# Patient Record
Sex: Female | Born: 1970 | Race: White | Hispanic: Yes | Marital: Married | Smoking: Current every day smoker
Health system: Southern US, Community
[De-identification: ages and names within clinical notes are randomized; demographics above are authoritative.]

## PROBLEM LIST (undated history)

## (undated) DIAGNOSIS — J45909 Unspecified asthma, uncomplicated: Secondary | ICD-10-CM

## (undated) DIAGNOSIS — E079 Disorder of thyroid, unspecified: Secondary | ICD-10-CM

## (undated) DIAGNOSIS — Z720 Tobacco use: Secondary | ICD-10-CM

## (undated) HISTORY — DX: Tobacco use: Z72.0

## (undated) HISTORY — DX: Unspecified asthma, uncomplicated: J45.909

## (undated) HISTORY — DX: Disorder of thyroid, unspecified: E07.9

---

## 2015-03-10 ENCOUNTER — Telehealth: Payer: Self-pay | Admitting: Behavioral Health

## 2015-03-10 NOTE — Telephone Encounter (Signed)
Unable to reach patient at time of Pre-Visit Call.  Left message for patient to return call when available.    

## 2015-03-13 ENCOUNTER — Encounter: Payer: Self-pay | Admitting: Family

## 2015-03-13 ENCOUNTER — Ambulatory Visit (INDEPENDENT_AMBULATORY_CARE_PROVIDER_SITE_OTHER): Payer: BLUE CROSS/BLUE SHIELD | Admitting: Family

## 2015-03-13 VITALS — BP 108/70 | HR 64 | Temp 98.0°F | Resp 16 | Ht 60.25 in | Wt 162.4 lb

## 2015-03-13 DIAGNOSIS — J4520 Mild intermittent asthma, uncomplicated: Secondary | ICD-10-CM

## 2015-03-13 DIAGNOSIS — R87619 Unspecified abnormal cytological findings in specimens from cervix uteri: Secondary | ICD-10-CM

## 2015-03-13 DIAGNOSIS — C539 Malignant neoplasm of cervix uteri, unspecified: Secondary | ICD-10-CM

## 2015-03-13 DIAGNOSIS — E0590 Thyrotoxicosis, unspecified without thyrotoxic crisis or storm: Secondary | ICD-10-CM

## 2015-03-13 DIAGNOSIS — Z8639 Personal history of other endocrine, nutritional and metabolic disease: Secondary | ICD-10-CM

## 2015-03-13 DIAGNOSIS — Z720 Tobacco use: Secondary | ICD-10-CM

## 2015-03-13 MED ORDER — PROAIR HFA 108 (90 BASE) MCG/ACT IN AERS: INHALATION_SPRAY | RESPIRATORY_TRACT | Status: DC

## 2015-03-13 MED ORDER — VARENICLINE TARTRATE 0.5 MG X 11 & 1 MG X 42 PO MISC: ORAL | Status: DC

## 2015-03-13 MED ORDER — FLUTICASONE-SALMETEROL 250-50 MCG/DOSE IN AEPB: 1.0000 | INHALATION_SPRAY | Freq: Two times a day (BID) | RESPIRATORY_TRACT | Status: DC

## 2015-03-13 MED ORDER — ALBUTEROL SULFATE (2.5 MG/3ML) 0.083% IN NEBU: 2.5000 mg | INHALATION_SOLUTION | Freq: Four times a day (QID) | RESPIRATORY_TRACT | Status: DC | PRN

## 2015-03-13 NOTE — Progress Notes (Signed)
Pre visit review using our clinic review tool, if applicable. No additional management support is needed unless otherwise documented below in the visit note. 

## 2015-03-13 NOTE — Progress Notes (Signed)
Subjective:    Patient ID: Makayla Hall, female    DOB: 17-Jun-1971, 44 y.o.   MRN: [id number]  HPI   [name] is a 44 yr old female who presents today to establish care.   Hx of abnormal pap- reports that she has hx of ? cervical "carcinoma" remotely. She does have hx of fibroids. She has not found local GYN.    Asthma- Reports that she is wheezing and needing to use her inhaler multiple times a day.  Did recently have a pred treatment. Symptoms are worse at night. Uses nebulizer prn.  She is a smoker.  Wants to quit.    History of hyperthyroid- 2008 diagnosed. She was treated with medication but gained 40 pounds so stopped meds. Denies heat intolerance. Sister had thyroid cancer.  Denies diarrhea or weight loss.      Review of Systems  Constitutional:       Has lost 20 pounds in last 1.5 months  HENT: Negative for rhinorrhea.   Eyes: Negative for visual disturbance.  Respiratory: Negative for cough.   Cardiovascular: Negative for chest pain.  Gastrointestinal: Negative for diarrhea and constipation.  Genitourinary: Negative for menstrual problem.  Musculoskeletal: Negative for myalgias and arthralgias.  Skin: Negative for rash.  Neurological: Negative for headaches.  Hematological: Negative for adenopathy.  Psychiatric/Behavioral:       Denies depression/anxiety   See HPI  Past Medical History  Diagnosis Date  . Asthma   . Thyroid disease     hyperthyroidism    History   Social History  . Marital Status: Married    Spouse Name: N/A  . Number of Children: N/A  . Years of Education: N/A   Occupational History  . Not on file.   Social History Main Topics  . Smoking status: Current Every Day Smoker -- 25 years  . Smokeless tobacco: Never Used     Comment: 5-8 cigarettes daily  . Alcohol Use: 0.0 oz/week    0 Standard drinks or equivalent per week     Comment: occasional  . Drug Use: No  . Sexual Activity: Not on file   Other Topics Concern  . Not on  file   Social History Narrative  . No narrative on file    History reviewed. No pertinent past surgical history.  Family History  Problem Relation Age of Onset  . Arthritis Mother   . Fibromyalgia Mother   . Cancer Maternal Aunt 68    lung    Allergies not on file  No current outpatient prescriptions on file prior to visit.   No current facility-administered medications on file prior to visit.    BP 108/70 mmHg  Pulse 64  Temp(Src) 98 F (36.7 C) (Oral)  Resp 16  Ht 5' 0.25" (1.53 m)  Wt 162 lb 6.4 oz (73.664 kg)  BMI 31.47 kg/m2  SpO2 99%  LMP 03/06/2015       Objective:   Physical Exam  Constitutional: She is oriented to person, place, and time. She appears well-developed and well-nourished.  HENT:  Head: Normocephalic and atraumatic.  Right Ear: Tympanic membrane and ear canal normal.  Left Ear: Tympanic membrane and ear canal normal.  Mouth/Throat: No oropharyngeal exudate, posterior oropharyngeal edema or posterior oropharyngeal erythema.  Eyes: No scleral icterus.  Neck: No thyromegaly present.  Cardiovascular: Normal rate, regular rhythm and normal heart sounds.   No murmur heard. Pulmonary/Chest: Effort normal and breath sounds normal. No respiratory distress. She has no wheezes.  Musculoskeletal: She exhibits no edema.  Lymphadenopathy:    She has no cervical adenopathy.  Neurological: She is alert and oriented to person, place, and time.  Skin: Skin is warm and dry. No rash noted.  Psychiatric: She has a normal mood and affect. Her behavior is normal. Judgment and thought content normal.          Assessment & Plan:

## 2015-03-13 NOTE — Patient Instructions (Addendum)
Schedule lab visit at the front desk. You will be contacted about the thyroid ultrasound. Start advair, continue albuterol as needed.   Start chantix. Follow up in 1 month.

## 2015-03-14 ENCOUNTER — Encounter: Payer: Self-pay | Admitting: Family

## 2015-03-15 ENCOUNTER — Encounter: Payer: Self-pay | Admitting: Family

## 2015-03-15 DIAGNOSIS — Z720 Tobacco use: Secondary | ICD-10-CM | POA: Insufficient documentation

## 2015-03-15 DIAGNOSIS — C539 Malignant neoplasm of cervix uteri, unspecified: Secondary | ICD-10-CM | POA: Insufficient documentation

## 2015-03-15 DIAGNOSIS — J45901 Unspecified asthma with (acute) exacerbation: Secondary | ICD-10-CM | POA: Insufficient documentation

## 2015-03-15 DIAGNOSIS — Z8639 Personal history of other endocrine, nutritional and metabolic disease: Secondary | ICD-10-CM | POA: Insufficient documentation

## 2015-03-15 NOTE — Assessment & Plan Note (Signed)
Will refer to GYN for surveillance/follow up.

## 2015-03-15 NOTE — Assessment & Plan Note (Signed)
Obtain follow up TFT's.

## 2015-03-15 NOTE — Assessment & Plan Note (Signed)
We discussed importance of smoking cessation.  Asthma is uncontrolled and likely exacerbated by ongoing smoking.  Add advair.

## 2015-03-15 NOTE — Assessment & Plan Note (Signed)
Trial of chantix. Common side effects including rare risk of suicide ideation was discussed with the patient today.  Patient is instructed to go directly to the ED if this occurs.  We discussed that patient can continue to smoke for 1 week after starting chantix, but then must discontinue cigarettes.  He is also instructed to contact us prior to completion of the starter month pack for an rx for the continuation month pack.  5 minutes spent with patient today on tobacco cessation counseling.   

## 2015-03-19 ENCOUNTER — Telehealth: Payer: Self-pay | Admitting: Family

## 2015-03-19 NOTE — Telephone Encounter (Signed)
Please remind pt to complete lab work (TFT's) ordered at visit.

## 2015-03-20 NOTE — Telephone Encounter (Signed)
LM for return call

## 2015-03-26 ENCOUNTER — Encounter: Payer: Self-pay | Admitting: Family

## 2015-03-30 NOTE — Telephone Encounter (Signed)
Attempted to reach pt and received voicemail.  Mailed letter / reminder to pt.

## 2015-04-17 ENCOUNTER — Ambulatory Visit: Payer: BLUE CROSS/BLUE SHIELD | Admitting: Family

## 2015-04-17 ENCOUNTER — Telehealth: Payer: Self-pay | Admitting: Family

## 2015-04-21 NOTE — Telephone Encounter (Signed)
Yes please

## 2015-04-21 NOTE — Telephone Encounter (Signed)
Pt was no show 04/17/15 5:00pm, 1 month f/u appt, left msg for pt to call and reschedule, charge for no show?

## 2015-05-02 ENCOUNTER — Other Ambulatory Visit: Payer: BLUE CROSS/BLUE SHIELD

## 2015-05-03 ENCOUNTER — Encounter: Payer: Self-pay | Admitting: Family

## 2015-05-03 LAB — T4, FREE: Free T4: 0.8 ng/dL (ref 0.60–1.60)

## 2015-05-03 LAB — T3, FREE: T3 FREE: 3.1 pg/mL (ref 2.3–4.2)

## 2015-05-03 LAB — TSH: TSH: 0.95 u[IU]/mL (ref 0.35–4.50)

## 2015-06-06 ENCOUNTER — Telehealth: Payer: Self-pay | Admitting: Family

## 2015-06-06 MED ORDER — VARENICLINE TARTRATE 1 MG PO TABS: 1.0000 mg | ORAL_TABLET | Freq: Two times a day (BID) | ORAL | Status: DC

## 2015-06-06 NOTE — Telephone Encounter (Signed)
Relation to TI:WPYK Call back [place]: [id number] - [place], [place] - [address] AT [place] [phone] (Phone) [phone] (Fax)         Reason for call:  Patient requesting a refill varenicline (CHANTIX STARTING MONTH PAK) 0.5 MG X 11 & 1 MG X 42 tablet

## 2015-06-14 ENCOUNTER — Encounter: Payer: Self-pay | Admitting: Family

## 2015-06-14 ENCOUNTER — Ambulatory Visit (INDEPENDENT_AMBULATORY_CARE_PROVIDER_SITE_OTHER): Payer: BLUE CROSS/BLUE SHIELD | Admitting: Family

## 2015-06-14 ENCOUNTER — Ambulatory Visit: Payer: BLUE CROSS/BLUE SHIELD | Admitting: Family

## 2015-06-14 VITALS — BP 102/66 | HR 86 | Temp 98.0°F | Resp 16 | Ht 60.25 in

## 2015-06-14 DIAGNOSIS — J45909 Unspecified asthma, uncomplicated: Secondary | ICD-10-CM

## 2015-06-14 DIAGNOSIS — J209 Acute bronchitis, unspecified: Secondary | ICD-10-CM | POA: Diagnosis not present

## 2015-06-14 MED ORDER — AZITHROMYCIN 250 MG PO TABS: ORAL_TABLET | ORAL | Status: DC

## 2015-06-14 MED ORDER — PREDNISONE 10 MG PO TABS
ORAL_TABLET | ORAL | Status: DC
Start: 2015-06-14 — End: 2015-08-02

## 2015-06-14 MED ORDER — GUAIFENESIN-CODEINE 100-10 MG/5ML PO SYRP: 5.0000 mL | ORAL_SOLUTION | Freq: Three times a day (TID) | ORAL | Status: DC | PRN

## 2015-06-14 NOTE — Progress Notes (Signed)
Subjective:    Patient ID: Makayla Hall, female    DOB: 03/13/1971, 44 y.o.   MRN: [id number]  HPI   Ms. Coles is a 44 yr old female who presents today with chief complaint of cough. Cough has been present x 3 weeks. Reports difficulty sleeping due to cough.  Tried advair without improvement (and developed rash).  Cough so hard that she has had some urinary leakage.  Took 2 20mg  prednisone which she feels helped.  Denies fever.  + sweating.  Cough is dry mostly.   Tobacco abuse-  Down to 2 cigs a day.  Tolerating chantix without side effects.    Review of Systems    see HPI  Past Medical History  Diagnosis Date  . Asthma   . Thyroid disease     hyperthyroidism  . Tobacco abuse     Social History   Social History  . Marital Status: Married    Spouse Name: N/A  . Number of Children: N/A  . Years of Education: N/A   Occupational History  . Not on file.   Social History Main Topics  . Smoking status: Current Every Day Smoker -- 25 years  . Smokeless tobacco: Never Used     Comment: 2 cigarettes daily  . Alcohol Use: 0.0 oz/week    0 Standard drinks or equivalent per week     Comment: occasional  . Drug Use: No  . Sexual Activity: Not on file   Other Topics Concern  . 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  Gardening       No past surgical history on file.  Family History  Problem Relation Age of Onset  . Arthritis Mother   . Fibromyalgia Mother     severe  . Cancer Maternal Aunt 68    lung  . Hypertension Father   . Obesity Sister   . Diabetes Sister   . Thyroid cancer Sister   . Diabetes Sister     Allergies  Allergen Reactions  . Advair Diskus [Fluticasone-Salmeterol] Rash    Current Outpatient Prescriptions on File  Prior to Visit  Medication Sig Dispense Refill  . albuterol (PROVENTIL) (2.5 MG/3ML) 0.083% nebulizer solution Take 3 mLs (2.5 mg total) by nebulization every 6 (six) hours as needed for wheezing or shortness of breath. 150 mL 1  . PROAIR HFA 108 (90 BASE) MCG/ACT inhaler Inhale 1 to 2 puffs as needed for asthma flare ups. 18 g 3  . varenicline (CHANTIX CONTINUING MONTH PAK) 1 MG tablet Take 1 tablet (1 mg total) by mouth 2 (two) times daily. 60 tablet 1   No current facility-administered medications on file prior to visit.    BP 102/66 mmHg  Pulse 86  Temp(Src) 98 F (36.7 C) (Oral)  Resp 16  Ht 5' 0.25" (1.53 m)  Wt   SpO2 98%  LMP 06/02/2015    Objective:   Physical Exam  Constitutional: She is oriented to person, place, and time. She appears well-developed and well-nourished.  HENT:  Head: Normocephalic.  Right Ear: Tympanic membrane and ear canal normal.  Left Ear: Tympanic membrane and ear canal normal.  Mouth/Throat: No oropharyngeal exudate, posterior oropharyngeal edema or posterior oropharyngeal erythema.  Cardiovascular: Normal rate, regular rhythm and normal heart sounds.   No murmur heard. Pulmonary/Chest: Effort normal. No respiratory distress. She has wheezes.  Musculoskeletal: She exhibits no edema.  Neurological: She is alert and oriented to person, place, and time.  Psychiatric: She has a normal mood and affect. Her behavior is normal. Judgment and thought content normal.          Assessment & Plan:  Bronchitis with acute asthma exacerbation- will rx with zpak, prednisone, prn cheratussin- pt advised this may cause drowsiness. Albuterol PRN. Urged complete smoking cessation.

## 2015-06-14 NOTE — Patient Instructions (Signed)
Start prednisone taper and zpak.  You may use cheratussin up to 3 x day, but this may cause drowsiness so use caution.  Continue albuterol 2 puffs every 6 hours. Call if symptoms worsen or if symptoms are not improved in 1 week.  Good luck quitting smoking.

## 2015-08-01 ENCOUNTER — Telehealth: Payer: Self-pay | Admitting: Family

## 2015-08-01 NOTE — Telephone Encounter (Signed)
Relation to VO:HCSP Call back [place]: [facility] - [place], [place] - [address] AT [place] [phone] (Phone) [phone] (Fax)         Reason for call:  Patient requesting the following the medication predniSONE (DELTASONE) 10 MG tablet, albuterol (PROVENTIL) (2.5 MG/3ML) 0.083% nebulizer solution, azithromycin (ZITHROMAX) 250 MG tablet, PROAIR HFA 108 (90 BASE) MCG/ACT inhaler.

## 2015-08-02 ENCOUNTER — Ambulatory Visit (INDEPENDENT_AMBULATORY_CARE_PROVIDER_SITE_OTHER): Payer: BLUE CROSS/BLUE SHIELD | Admitting: Family

## 2015-08-02 ENCOUNTER — Encounter: Payer: Self-pay | Admitting: Family

## 2015-08-02 VITALS — BP 80/60 | HR 83 | Temp 98.1°F | Resp 16 | Ht 60.25 in

## 2015-08-02 DIAGNOSIS — J4521 Mild intermittent asthma with (acute) exacerbation: Secondary | ICD-10-CM | POA: Diagnosis not present

## 2015-08-02 MED ORDER — BUDESONIDE-FORMOTEROL FUMARATE 80-4.5 MCG/ACT IN AERO: 2.0000 | INHALATION_SPRAY | Freq: Two times a day (BID) | RESPIRATORY_TRACT | Status: DC

## 2015-08-02 MED ORDER — AZITHROMYCIN 250 MG PO TABS: ORAL_TABLET | ORAL | Status: DC

## 2015-08-02 MED ORDER — PROAIR HFA 108 (90 BASE) MCG/ACT IN AERS: INHALATION_SPRAY | RESPIRATORY_TRACT | Status: DC

## 2015-08-02 MED ORDER — PREDNISONE 10 MG PO TABS: ORAL_TABLET | ORAL | Status: DC

## 2015-08-02 NOTE — Telephone Encounter (Signed)
Patient needs to be seen in office due to worsening of her asthma symptoms.

## 2015-08-02 NOTE — Telephone Encounter (Signed)
Notified pt and scheduled her for today at 11:30am with [facility].

## 2015-08-02 NOTE — Telephone Encounter (Signed)
Spoke with pt. She states she is having similar symptoms as when she was seen in September.  Has had increased cough that keeps her up during the night since last Thursday.  She has started taking mucines. Has had to use her inhaler 15-20 times a day and using nebulizer 1-2 times at night. States cough is "ok during the day unless her chest gets cold, if she walks and gets hot then cools down, etc. . . " Denies any fevers. Pt is requesting zpack, prednisone, albuterol inhaler / nebulizer refills. Advised pt she may need office visit for antibiotic and she asks if she can just get the rest of her medications refilled. Thinks her asthma is just flared up.  Please advise?

## 2015-08-02 NOTE — Telephone Encounter (Signed)
Pt calling again about below. Pt has asthma and said that she wanted a call yesterday and is surprised that she didn't hear from anyone. Please let her know if she can get refills on meds below or if she has to come in. She's been having sx since last Thursday. Best ph # [phone] work       OR cell [phone]

## 2015-08-02 NOTE — Patient Instructions (Signed)
Start prednisone taper, zpak for possible bronchitis, add symbicort 2 x daily.   Continue albuterol every 6 hours until symptoms are improved. Continue to work on quitting smoking completely.

## 2015-08-02 NOTE — Assessment & Plan Note (Addendum)
Deteriorated. Add symbicort, pred taper, albuterol Q6, empiric rx with zpak.    We discussed the mild hypopigmentation on her neck/face- offered referral to derm but she declines.  Advised pt to let me know if this worsen. Advised pt that I don't think that this was caused by advair.

## 2015-08-02 NOTE — Progress Notes (Signed)
Subjective:    Patient ID: Makayla Hall, female    DOB: November 06, 1970, 44 y.o.   MRN: [id number]  HPI  Makayla Hall is a 44 yr old female with history of Asthma and tobacco abuse who presents today with chief complaint of cough.  Worse at night. Productive of white thick mucous since last Thursday. Denies fever or nasal congestion.  Tried mucinex, humidifier last night with some   Tobacco abuse- has cut back.  Doesn't smoke on weekends.  3 cig/day during week. Completed chantix.   She does report some "skin rash" on her neck.     Review of Systems See HPI  Past Medical History  Diagnosis Date  . Asthma   . Thyroid disease     hyperthyroidism  . Tobacco abuse     Social History   Social History  . Marital Status: Married    Spouse Name: N/A  . Number of Children: N/A  . Years of Education: N/A   Occupational History  . Not on file.   Social History Main Topics  . Smoking status: Current Every Day Smoker -- 25 years  . Smokeless tobacco: Never Used     Comment: 2 cigarettes on weekend  . Alcohol Use: 0.0 oz/week    0 Standard drinks or equivalent per week     Comment: occasional  . Drug Use: No  . Sexual Activity: Not on file   Other Topics Concern  . 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  Gardening       No past surgical history on file.  Family History  Problem Relation Age of Onset  . Arthritis Mother   . Fibromyalgia Mother     severe  . Cancer Maternal Aunt 68    lung  . Hypertension Father   . Obesity Sister   . Diabetes Sister   . Thyroid cancer Sister   . Diabetes Sister     Allergies  Allergen Reactions  . Advair Diskus [Fluticasone-Salmeterol] Rash    Current Outpatient Prescriptions on File Prior to Visit    Medication Sig Dispense Refill  . albuterol (PROVENTIL) (2.5 MG/3ML) 0.083% nebulizer solution Take 3 mLs (2.5 mg total) by nebulization every 6 (six) hours as needed for wheezing or shortness of breath. 150 mL 1   No current facility-administered medications on file prior to visit.    BP 80/60 mmHg  Pulse 83  Temp(Src) 98.1 F (36.7 C) (Oral)  Resp 16  Ht 5' 0.25" (1.53 m)  Wt   SpO2 98%  LMP 07/19/2015        Objective:   Physical Exam  Constitutional: She is oriented to person, place, and time. She appears well-developed and well-nourished.  HENT:  Right Ear: Tympanic membrane and ear canal normal.  Left Ear: Tympanic membrane and ear canal normal.  Mouth/Throat: Posterior oropharyngeal erythema present. No oropharyngeal exudate or posterior oropharyngeal edema.  Cardiovascular: Normal rate, regular rhythm and normal heart sounds.   No murmur heard. Pulmonary/Chest: Effort normal. No respiratory distress. She has wheezes in the right lower field. She has rhonchi.  Lymphadenopathy:    She has no cervical adenopathy.  Neurological: She is alert and oriented to person, place, and time.  Skin:  Mild hypopigmentation noted on neck/chest   Psychiatric: She has a normal mood and affect. Her behavior is normal. Judgment and thought content normal.          Assessment & Plan:

## 2015-09-01 ENCOUNTER — Ambulatory Visit (INDEPENDENT_AMBULATORY_CARE_PROVIDER_SITE_OTHER): Payer: BLUE CROSS/BLUE SHIELD | Admitting: Behavioral Health

## 2015-09-01 DIAGNOSIS — Z23 Encounter for immunization: Secondary | ICD-10-CM

## 2015-09-01 NOTE — Progress Notes (Signed)
Pre visit review using our clinic review tool, if applicable. No additional management support is needed unless otherwise documented below in the visit note. 

## 2015-12-18 ENCOUNTER — Other Ambulatory Visit: Payer: Self-pay | Admitting: Family

## 2016-01-01 ENCOUNTER — Ambulatory Visit (HOSPITAL_BASED_OUTPATIENT_CLINIC_OR_DEPARTMENT_OTHER)
Admission: RE | Admit: 2016-01-01 | Discharge: 2016-01-01 | Disposition: A | Discharge status: Home / Self Care | Payer: BLUE CROSS/BLUE SHIELD | Source: Ambulatory Visit | Attending: Family | Admitting: Family

## 2016-01-01 ENCOUNTER — Ambulatory Visit (HOSPITAL_BASED_OUTPATIENT_CLINIC_OR_DEPARTMENT_OTHER)
Admission: RE | Admit: 2016-01-01 | Discharge: 2016-01-01 | Disposition: A | Discharge status: Home / Self Care | Payer: BLUE CROSS/BLUE SHIELD | Source: Ambulatory Visit | Attending: Family Medicine | Admitting: Family Medicine

## 2016-01-01 ENCOUNTER — Ambulatory Visit (INDEPENDENT_AMBULATORY_CARE_PROVIDER_SITE_OTHER): Payer: BLUE CROSS/BLUE SHIELD | Admitting: Family Medicine

## 2016-01-01 VITALS — BP 100/64 | HR 79 | Temp 97.6°F | Ht 61.5 in | Wt 168.0 lb

## 2016-01-01 DIAGNOSIS — E0590 Thyrotoxicosis, unspecified without thyrotoxic crisis or storm: Secondary | ICD-10-CM

## 2016-01-01 DIAGNOSIS — J4521 Mild intermittent asthma with (acute) exacerbation: Secondary | ICD-10-CM

## 2016-01-01 DIAGNOSIS — R062 Wheezing: Secondary | ICD-10-CM

## 2016-01-01 DIAGNOSIS — R059 Cough, unspecified: Secondary | ICD-10-CM

## 2016-01-01 DIAGNOSIS — R05 Cough: Secondary | ICD-10-CM | POA: Insufficient documentation

## 2016-01-01 MED ORDER — PROAIR HFA 108 (90 BASE) MCG/ACT IN AERS: INHALATION_SPRAY | RESPIRATORY_TRACT | Status: DC

## 2016-01-01 MED ORDER — IPRATROPIUM BROMIDE 0.02 % IN SOLN
0.5000 mg | Freq: Once | RESPIRATORY_TRACT | Status: AC
  Administered 2016-01-01: 0.5 mg via RESPIRATORY_TRACT

## 2016-01-01 MED ORDER — ALBUTEROL SULFATE (2.5 MG/3ML) 0.083% IN NEBU
2.5000 mg | INHALATION_SOLUTION | Freq: Once | RESPIRATORY_TRACT | Status: AC
  Administered 2016-01-01: 2.5 mg via RESPIRATORY_TRACT

## 2016-01-01 MED ORDER — MOMETASONE FUROATE 200 MCG/ACT IN AERO: 2.0000 | INHALATION_SPRAY | Freq: Two times a day (BID) | RESPIRATORY_TRACT | Status: DC

## 2016-01-01 MED ORDER — HYDROCODONE-HOMATROPINE 5-1.5 MG/5ML PO SYRP: 5.0000 mL | ORAL_SOLUTION | Freq: Three times a day (TID) | ORAL | Status: DC | PRN

## 2016-01-01 MED ORDER — PREDNISONE 20 MG PO TABS: ORAL_TABLET | ORAL | Status: DC

## 2016-01-01 NOTE — Progress Notes (Signed)
Pre visit review using our clinic review tool, if applicable. No additional management support is needed unless otherwise documented below in the visit note. 

## 2016-01-01 NOTE — Patient Instructions (Signed)
Please use the oral prednisone as directed and also the inhaled steroid (asmanex) I hope that the asmanex will help to prevent your wheezing We can also consider starting your on singulair if needed Use the cough medication as needed but remember it will make you sleepy! We will get a chest x-ray today and I will let you know the results  Please, please quit smoking!!

## 2016-01-01 NOTE — Progress Notes (Signed)
[place] at [facility] [address] [place], [place] [address] [phone]  Date:  01/01/2016   Name:  Makayla Hall   DOB:  [birth year]   MRN:  [id number]  PCP:  Nance Pear., NP    Chief Complaint: Cough   History of Present Illness:  Makayla Hall is a 45 y.o. very pleasant female patient who presents with the following:  She has a history of asthma and has been wheezing and coughing more as of late.  She has been seeing the NP at her job about once a month over the last year for asthma exacerbations   She has been taking oral steroids with these episodes and also generally abx  She is supposed to be on symbicort but it not taking it- she could not afford the symbicort.  She does not take any claritin or other allergy med right now.    She had a rash with advair or maybe chantix as she was using both at the time- we are not quite sure.  Never had any other rash with inhaled steroid or long acting B 2 agonist.  Over the last 2 weeks she has had worse wheezing and productive cough again typical of her exacerbations.   No fever or flu like symptoms She does not otherwise feel sick  She is using albuterol inhaler "20x a day." She may use an albuterol neb once at night depending on her symptoms Her sx are worse with exertion, exposure to dust and allergens She is also still smoking  LMP was 3/28   Patient Active Problem List   Diagnosis Date Noted  . 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 Asthma with acute exacerbation 03/15/2015  . History of hyperthyroidism 03/15/2015  . Tobacco abuse 03/15/2015    Past Medical History  Diagnosis Date  . Asthma   . Thyroid disease     hyperthyroidism  . Tobacco abuse     No past surgical history on file.  Social History  Substance Use Topics  . Smoking status: Current Every Day Smoker -- 25 years  . Smokeless tobacco: Never Used     Comment: 2 cigarettes on  weekend  . Alcohol Use: 0.0 oz/week    0 Standard drinks or equivalent per week     Comment: occasional    Family History  Problem Relation Age of Onset  . Arthritis Mother   . Fibromyalgia Mother     severe  . Cancer Maternal Aunt 68    lung  . Hypertension Father   . Obesity Sister   . Diabetes Sister   . Thyroid cancer Sister   . Diabetes Sister     Allergies  Allergen Reactions  . Advair Diskus [Fluticasone-Salmeterol] Rash    Medication list has been reviewed and updated.  Current Outpatient Prescriptions on File Prior to Visit  Medication Sig Dispense Refill  . albuterol (PROVENTIL) (2.5 MG/3ML) 0.083% nebulizer solution USE 1 VIAL VIA NEBULIZER EVERY 6 HOURS AS NEEDED FOR WHEEZING OR SHORTNESS OF BREATH 150 mL 1  . predniSONE (DELTASONE) 10 MG tablet 4 tabs by mouth once daily x 2 days, then 3 tabs daily x 2 days, then 2 tabs daily x 2 days, then 1 tab daily x 2 days then stop 20 tablet 0  . PROAIR HFA 108 (90 BASE) MCG/ACT inhaler Inhale 1 to 2 puffs as needed for asthma flare ups. 18 g 3  . budesonide-formoterol (SYMBICORT) 80-4.5  MCG/ACT inhaler Inhale 2 puffs into the lungs 2 (two) times daily. (Patient not taking: Reported on 01/01/2016) 1 Inhaler 3   No current facility-administered medications on file prior to visit.    Review of Systems:  As per HPI- otherwise negative.   Physical Examination: Filed Vitals:   01/01/16 1311  BP: 100/64  Pulse: 79  Temp: 97.6 F (36.4 C)   Filed Vitals:   01/01/16 1311  Height: 5' 1.5" (1.562 m)  Weight: 168 lb (76.204 kg)   Body mass index is 31.23 kg/(m^2). Ideal Body Weight: Weight in (lb) to have BMI = 25: 134.2  GEN: WDWN, NAD, Non-toxic, A & O x 3, overweight, tobacco odor HEENT: Atraumatic, Normocephalic. Neck supple. No masses, No LAD.  Bilateral TM wnl, oropharynx normal.  PEERL,EOMI.   Ears and Nose: No external deformity. CV: RRR, No M/G/R. No JVD. No thrill. No extra heart sounds. PULM: mild  wheezing bilaterally, no crackles, rhonchi. No retractions. No resp. distress. No accessory muscle use. EXTR: No c/c/e NEURO Normal gait.  PSYCH: Normally interactive. Conversant. Not depressed or anxious appearing.  Calm demeanor.   duoneb given in clinic- she felt better and had less wheezing Assessment and Plan: Wheezing - Plan: albuterol (PROVENTIL) (2.5 MG/3ML) 0.083% nebulizer solution 2.5 mg, ipratropium (ATROVENT) nebulizer solution 0.5 mg, predniSONE (DELTASONE) 20 MG tablet, Mometasone Furoate (ASMANEX HFA) 200 MCG/ACT AERO, PROAIR HFA 108 (90 Base) MCG/ACT inhaler, DG Chest 2 View  Cough - Plan: HYDROcodone-homatropine (HYCODAN) 5-1.5 MG/5ML syrup  Intermittent asthma, uncontrolled, with acute exacerbation  Here today with asthma exacerbation- she reports that she is having these a lot recently Gave her a voucher for asmanex so she can try this for free and see if it helps. Will treat with oral prednisone again so the asmanex has a chance to work Hycodan to use as needed for cough Checked a CXR today- called and LMOM that is is normal.  No abx for now She will let me know if not feeling better soon  Meds ordered this encounter  Medications  . albuterol (PROVENTIL) (2.5 MG/3ML) 0.083% nebulizer solution 2.5 mg    Sig:   . ipratropium (ATROVENT) nebulizer solution 0.5 mg    Sig:   . predniSONE (DELTASONE) 20 MG tablet    Sig: Take 2 pills a day for 3 days, then 1 pill a day for 3 days    Dispense:  9 tablet    Refill:  0  . Mometasone Furoate (ASMANEX HFA) 200 MCG/ACT AERO    Sig: Inhale 2 puffs into the lungs 2 (two) times daily.    Dispense:  1 Inhaler    Refill:  12  . PROAIR HFA 108 (90 Base) MCG/ACT inhaler    Sig: Inhale 1 to 2 puffs every 4 hours as needed for asthma flare ups.    Dispense:  3 Inhaler    Refill:  3  . HYDROcodone-homatropine (HYCODAN) 5-1.5 MG/5ML syrup    Sig: Take 5 mLs by mouth every 8 (eight) hours as needed for cough.    Dispense:  90 mL     Refill:  0      Dg Chest 2 View  01/01/2016  CLINICAL DATA:  Cough x2 weeks, wheezing, persistent asthma EXAM: CHEST  2 VIEW COMPARISON:  09/12/2014 FINDINGS: Lungs are clear.  No pleural effusion or pneumothorax. The heart is normal in size. Mild degenerative changes of the visualized thoracolumbar spine. IMPRESSION: Normal chest radiographs. Electronically Signed   By: Bertis Ruddy  [place] Pink M.D.   On: 01/01/2016 14:19     Signed Lamar Blinks, MD

## 2016-03-04 ENCOUNTER — Encounter: Payer: Self-pay | Admitting: Medical

## 2016-03-04 ENCOUNTER — Ambulatory Visit (INDEPENDENT_AMBULATORY_CARE_PROVIDER_SITE_OTHER): Payer: BLUE CROSS/BLUE SHIELD | Admitting: Medical

## 2016-03-04 VITALS — BP 112/70 | HR 77 | Temp 98.1°F | Ht 61.5 in

## 2016-03-04 DIAGNOSIS — J209 Acute bronchitis, unspecified: Secondary | ICD-10-CM

## 2016-03-04 DIAGNOSIS — J309 Allergic rhinitis, unspecified: Secondary | ICD-10-CM

## 2016-03-04 DIAGNOSIS — R062 Wheezing: Secondary | ICD-10-CM

## 2016-03-04 MED ORDER — MONTELUKAST SODIUM 10 MG PO TABS: 10.0000 mg | ORAL_TABLET | Freq: Every day | ORAL | Status: DC

## 2016-03-04 MED ORDER — PREDNISONE 20 MG PO TABS: ORAL_TABLET | ORAL | Status: DC

## 2016-03-04 MED ORDER — AZITHROMYCIN 250 MG PO TABS: ORAL_TABLET | ORAL | Status: DC

## 2016-03-04 MED ORDER — LEVOCETIRIZINE DIHYDROCHLORIDE 5 MG PO TABS: 5.0000 mg | ORAL_TABLET | Freq: Every evening | ORAL | Status: DC

## 2016-03-04 NOTE — Progress Notes (Signed)
   Subjective:    Patient ID: Makayla Hall, female    DOB: 30-Mar-1971, 45 y.o.   MRN: [id number]  HPI  Pt last Friday states she had some cough and wheezing. Pt states provider 2 weeks ago have some prednisone. Pt states she has refill of prednisone. She is about to restart. This morning some productive cough. Pt used some symbicort in past put got rash. Pt is using asmanxex 2 puffs a day. Pt has albuterol inhaler and used one inhaler in about one week. Pt not controlled for about one year. Pt used to live in [place]. Pt started singulair 2 weeks. Ago states she wants to see pulmonologist since her flares are som frequent.    Review of Systems  Constitutional: Negative for fever, chills and fatigue.  HENT: Negative for dental problem and ear discharge.   Respiratory: Positive for cough and wheezing. Negative for chest tightness and shortness of breath.        Productive cough for one day.  Cardiovascular: Negative for chest pain and palpitations.  Skin: Negative for rash.  Neurological: Negative for facial asymmetry and light-headedness.  Hematological: Negative for adenopathy. Does not bruise/bleed easily.  Psychiatric/Behavioral: Negative for behavioral problems and confusion.       Objective:   Physical Exam  General  Mental Status - Alert. General Appearance - Well groomed. Not in acute distress.  Skin Rashes- No Rashes.  HEENT Head- Normal. Ear Auditory Canal - Left- Normal. Right - Normal.Tympanic Membrane- Left- Normal. Right- Normal. Eye Sclera/Conjunctiva- Left- Normal. Right- Normal. Nose & Sinuses Nasal Mucosa- Left-  Boggy and Congested. Right-  Boggy and  Congested.Bilateral maxillary and frontal sinus pressure. Mouth & Throat Lips: Upper Lip- Normal: no dryness, cracking, pallor, cyanosis, or vesicular eruption. Lower Lip-Normal: no dryness, cracking, pallor, cyanosis or vesicular eruption. Buccal Mucosa- Bilateral- No Aphthous ulcers. Oropharynx- No  Discharge or Erythema. Tonsils: Characteristics- Bilateral- No Erythema or Congestion. Size/Enlargement- Bilateral- No enlargement. Discharge- bilateral-None.  Neck Neck- Supple. No Masses.   Chest and Lung Exam Auscultation: Breath Sounds:-even and unlabored but tight and scattered wheezing.  Cardiovascular Auscultation:Rythm- Regular, rate and rhythm. Murmurs & Other Heart Sounds:Ausculatation of the heart reveal- No Murmurs.  Lymphatic Head & Neck General Head & Neck Lymphatics: Bilateral: Description- No Localized lymphadenopathy.       Assessment & Plan:  I think allergies area playing role in your chronic asthma flares.  I am prescribing xyzal and montelukast.  Since you are having recurrent flares despite use of asmanex and albuterol will add prednisone taper. I think you may need moderate prolonged course. Not sure if one written earlier will be adequate.  Rx azithrmoycin it your productive cough that started today persists  Will refer you to pulmonologist.  Follow up in 2 wks or as needed  Emnet Monk, Percell Miller, [employer]

## 2016-03-04 NOTE — Progress Notes (Signed)
Pre visit review using our clinic review tool, if applicable. No additional management support is needed unless otherwise documented below in the visit note. 

## 2016-03-04 NOTE — Patient Instructions (Signed)
I think allergies area playing role in your chronic asthma flares.  I am prescribing xyzal and montelukast.  Since you are having recurrent flares despite use of asmanex and albuterol will add prednisone taper. I think you may need moderate prolonged course. Not sure if one written earlier will be adequate.  Rx azithrmoycin it your productive cough that started today persists  Will refer you to pulmonologist.  Follow up in 2 wks or as needed

## 2016-07-01 ENCOUNTER — Encounter: Payer: Self-pay | Admitting: Medical

## 2016-07-01 ENCOUNTER — Ambulatory Visit: Payer: BLUE CROSS/BLUE SHIELD | Admitting: Medical

## 2016-07-01 ENCOUNTER — Ambulatory Visit (INDEPENDENT_AMBULATORY_CARE_PROVIDER_SITE_OTHER): Payer: BLUE CROSS/BLUE SHIELD | Admitting: Medical

## 2016-07-01 VITALS — BP 116/82 | HR 62 | Temp 98.0°F

## 2016-07-01 DIAGNOSIS — H938X1 Other specified disorders of right ear: Secondary | ICD-10-CM | POA: Diagnosis not present

## 2016-07-01 DIAGNOSIS — R519 Headache, unspecified: Secondary | ICD-10-CM

## 2016-07-01 DIAGNOSIS — Z8719 Personal history of other diseases of the digestive system: Secondary | ICD-10-CM

## 2016-07-01 DIAGNOSIS — R51 Headache: Secondary | ICD-10-CM | POA: Diagnosis not present

## 2016-07-01 DIAGNOSIS — J3489 Other specified disorders of nose and nasal sinuses: Secondary | ICD-10-CM | POA: Diagnosis not present

## 2016-07-01 LAB — SEDIMENTATION RATE: Sed Rate: 5 mm/hr (ref 0–20)

## 2016-07-01 MED ORDER — AMOXICILLIN-POT CLAVULANATE 875-125 MG PO TABS: 1.0000 | ORAL_TABLET | Freq: Two times a day (BID) | ORAL | 0 refills | Status: DC

## 2016-07-01 MED ORDER — MONTELUKAST SODIUM 10 MG PO TABS: 10.0000 mg | ORAL_TABLET | Freq: Every day | ORAL | 1 refills | Status: DC

## 2016-07-01 MED ORDER — AZELASTINE HCL 0.1 % NA SOLN: 2.0000 | Freq: Two times a day (BID) | NASAL | 3 refills | Status: DC

## 2016-07-01 MED ORDER — LEVOCETIRIZINE DIHYDROCHLORIDE 5 MG PO TABS: 5.0000 mg | ORAL_TABLET | Freq: Every evening | ORAL | 2 refills | Status: DC

## 2016-07-01 NOTE — Progress Notes (Signed)
   Subjective:    Patient ID: Makayla Hall, female    DOB: [birth year], 45 y.o.   MRN: [id number]  HPI  Pt in for some recent rt side  jaw area soreness for about 2 weeks. She states some jaw soreness upper aspect. Pain for about 2 weeks. When she opens mouth notes. No teeth pain when she eats. Pt has appointment on 17 th October to see dentis. Pt has know teeth decay and some broken teeth. Pt also notes some rt ear pressure. She has some faint rt side maxillary sinus pressure recently as well with faint nasal congestion.    Pt states last month she had left lower tooth abcess and had to be on antibiotic for 2 weeks.  Pt states some faint rt temporal area pain for 2-3 days. No vision changes that she associates with.    Review of Systems  Constitutional: Negative for chills, diaphoresis, fatigue, fever and unexpected weight change.  HENT: Positive for congestion, ear pain and sinus pressure. Negative for facial swelling, sore throat and trouble swallowing.        Rt ear pressure.  See hpi.  Not no crepitus of rt tmj joint.  Respiratory: Negative for cough, chest tightness and wheezing.   Cardiovascular: Negative for chest pain and palpitations.  Gastrointestinal: Negative for abdominal pain.  Musculoskeletal: Negative for back pain.  Skin: Negative for rash.  Neurological: Negative for dizziness, seizures, syncope, speech difficulty, weakness, numbness and headaches.  Hematological: Negative for adenopathy. Does not bruise/bleed easily.  Psychiatric/Behavioral: Negative for behavioral problems and confusion.       Objective:   Physical Exam  General  Mental Status - Alert. General Appearance - Well groomed. Not in acute distress.  Skin Rashes- No Rashes. Rt temproral area. No dilated veins.  HEENT Head- Normal. Ear Auditory Canal - Left- Normal. Right - Normal.Tympanic Membrane- Left- Normal. Right- Normal. Eye Sclera/Conjunctiva- Left- Normal. Right- Normal. Nose &  Sinuses Nasal Mucosa- Left-  Boggy and Congested. Right-  Boggy and  Congested.Bilateral no  maxillary and no frontal sinus pressure. Mouth & Throat Lips: Upper Lip- Normal: no dryness, cracking, pallor, cyanosis, or vesicular eruption. Lower Lip-Normal: no dryness, cracking, pallor, cyanosis or vesicular eruption. Buccal Mucosa- Bilateral- No Aphthous ulcers. Oropharynx- No Discharge or Erythema. Tonsils: Characteristics- Bilateral- No Erythema or Congestion. Size/Enlargement- Bilateral- No enlargement. Discharge- bilateral-None Rt side no tmj crepitus on palpation Mouth- on inspection. Poor dentition. Worse on rt side..  Neck Neck- Supple. No Masses.   Chest and Lung Exam Auscultation: Breath Sounds:-Clear even and unlabored.  Cardiovascular Auscultation:Rythm- Regular, rate and rhythm. Murmurs & Other Heart Sounds:Ausculatation of the heart reveal- No Murmurs.  Lymphatic Head & Neck General Head & Neck Lymphatics: Bilateral: Description- No Localized lymphadenopathy.       Assessment & Plan:  For your ear discomfort, sinus pressure and history of tooth infections will rx augmentin antibiotic. I do think this is good idea in light of the fact you have upcoming dental procedure in about 8 days.  You may have some eustachian tube pressure related to allergies so will rx astelin spray. I am refilling your singulair since this seemed to help your allergies. (wanted to rx steroid but epic brought up allergic reaction)  We will check you sed rate today. If elevated would let you know and give tx.   Follow up in 7 days or as needed  Cheo Selvey, Percell Miller, [employer]

## 2016-07-01 NOTE — Patient Instructions (Addendum)
For your ear discomfort, sinus pressure and history of tooth infections will rx augmentin antibiotic. I do think this is good idea in light of the fact you have upcoming dental procedure in about 8 days.  You may have some eustachian tube pressure related to allergies so will rx astelin. I am refilling your singulair since this seemed to help your allergies.(wanted to rx steroid but epic brought up allergic reaction)  We will check you sed rate today. If elevated would let you know and give tx.   Follow up in 7 days or as needed

## 2016-07-03 ENCOUNTER — Emergency Department (HOSPITAL_BASED_OUTPATIENT_CLINIC_OR_DEPARTMENT_OTHER): Payer: BLUE CROSS/BLUE SHIELD

## 2016-07-03 ENCOUNTER — Ambulatory Visit (INDEPENDENT_AMBULATORY_CARE_PROVIDER_SITE_OTHER): Payer: BLUE CROSS/BLUE SHIELD | Admitting: Medical

## 2016-07-03 ENCOUNTER — Emergency Department (HOSPITAL_BASED_OUTPATIENT_CLINIC_OR_DEPARTMENT_OTHER)
Admission: EM | Admit: 2016-07-03 | Discharge: 2016-07-03 | Disposition: A | Discharge status: Home / Self Care | Payer: BLUE CROSS/BLUE SHIELD | Attending: Physician Assistant | Admitting: Physician Assistant

## 2016-07-03 ENCOUNTER — Telehealth: Payer: Self-pay | Admitting: Family

## 2016-07-03 ENCOUNTER — Encounter (HOSPITAL_BASED_OUTPATIENT_CLINIC_OR_DEPARTMENT_OTHER): Payer: Self-pay | Admitting: *Deleted

## 2016-07-03 ENCOUNTER — Encounter: Payer: Self-pay | Admitting: Medical

## 2016-07-03 VITALS — BP 103/68 | HR 84 | Temp 98.9°F | Ht 61.5 in

## 2016-07-03 DIAGNOSIS — Z7951 Long term (current) use of inhaled steroids: Secondary | ICD-10-CM | POA: Insufficient documentation

## 2016-07-03 DIAGNOSIS — F17200 Nicotine dependence, unspecified, uncomplicated: Secondary | ICD-10-CM | POA: Diagnosis not present

## 2016-07-03 DIAGNOSIS — K047 Periapical abscess without sinus: Secondary | ICD-10-CM | POA: Insufficient documentation

## 2016-07-03 DIAGNOSIS — R220 Localized swelling, mass and lump, head: Secondary | ICD-10-CM

## 2016-07-03 DIAGNOSIS — H938X9 Other specified disorders of ear, unspecified ear: Secondary | ICD-10-CM

## 2016-07-03 DIAGNOSIS — R6884 Jaw pain: Secondary | ICD-10-CM

## 2016-07-03 DIAGNOSIS — J45901 Unspecified asthma with (acute) exacerbation: Secondary | ICD-10-CM | POA: Diagnosis not present

## 2016-07-03 DIAGNOSIS — R591 Generalized enlarged lymph nodes: Secondary | ICD-10-CM | POA: Diagnosis not present

## 2016-07-03 LAB — CBC WITH DIFFERENTIAL/PLATELET
BASOS ABS: 0.1 10*3/uL (ref 0.0–0.1)
BASOS PCT: 1 %
EOS ABS: 0.5 10*3/uL (ref 0.0–0.7)
EOS PCT: 5 %
HCT: 41.8 % (ref 36.0–46.0)
Hemoglobin: 14.3 g/dL (ref 12.0–15.0)
Lymphocytes Relative: 34 %
Lymphs Abs: 3.7 10*3/uL (ref 0.7–4.0)
MCH: 30.2 pg (ref 26.0–34.0)
MCHC: 34.2 g/dL (ref 30.0–36.0)
MCV: 88.4 fL (ref 78.0–100.0)
MONO ABS: 0.8 10*3/uL (ref 0.1–1.0)
Monocytes Relative: 7 %
NEUTROS ABS: 5.7 10*3/uL (ref 1.7–7.7)
Neutrophils Relative %: 53 %
PLATELETS: 385 10*3/uL (ref 150–400)
RBC: 4.73 MIL/uL (ref 3.87–5.11)
RDW: 13.6 % (ref 11.5–15.5)
WBC: 10.7 10*3/uL — ABNORMAL HIGH (ref 4.0–10.5)

## 2016-07-03 LAB — BASIC METABOLIC PANEL
ANION GAP: 6 (ref 5–15)
BUN: 17 mg/dL (ref 6–20)
CALCIUM: 9.1 mg/dL (ref 8.9–10.3)
CO2: 26 mmol/L (ref 22–32)
Chloride: 106 mmol/L (ref 101–111)
Creatinine, Ser: 0.77 mg/dL (ref 0.44–1.00)
Glucose, Bld: 106 mg/dL — ABNORMAL HIGH (ref 65–99)
Potassium: 4.5 mmol/L (ref 3.5–5.1)
SODIUM: 138 mmol/L (ref 135–145)

## 2016-07-03 MED ORDER — IOPAMIDOL (ISOVUE-300) INJECTION 61%
100.0000 mL | Freq: Once | INTRAVENOUS | Status: AC | PRN
  Administered 2016-07-03: 100 mL via INTRAVENOUS

## 2016-07-03 NOTE — ED Provider Notes (Signed)
[facility] Provider Note   CSN: [id number] Arrival date & time: 07/03/16  1703  By signing my name below, I, Makayla Hall, attest that this documentation has been prepared under the direction and in the presence of Domenic Moras, PA-C. Electronically Signed: Irene Hall, ED Scribe. 07/03/16. 5:32 PM.  History   Chief Complaint Chief Complaint  Patient presents with  . Jaw Pain   The history is provided by the patient. No language interpreter was used.   HPI Comments: Makayla Hall is a 45 y.o. female who presents to the Emergency Department complaining of right jaw pain and swelling onset 4 days ago. Pt has been seen by her PCP for the same x2 and sent here for further evaluation. Pt said that the pain preceded the swelling for about 3 weeks. She was prescribed amoxicillin for her symptoms to mild relief. Today is day 3 of taking the antibiotics. She says that her teeth are "extremely bad" right now and does not know if this is contributing to her symptoms. She has mild dental pain. Pt ate this morning then had immediate swelling. She denies chest pain, SOB, fever, chills, nausea, vomiting, or trouble swallowing.   Past Medical History:  Diagnosis Date  . Asthma   . Thyroid disease    hyperthyroidism  . Tobacco abuse     Patient Active Problem List   Diagnosis Date Noted  . Carcinoma of cervix ([place]) 03/15/2015  . Asthma with acute exacerbation 03/15/2015  . History of hyperthyroidism 03/15/2015  . Tobacco abuse 03/15/2015    History reviewed. No pertinent surgical history.  OB History    No data available     Home Medications    Prior to Admission medications   Medication Sig Start Date End Date Taking? Authorizing Provider  albuterol (PROVENTIL) (2.5 MG/3ML) 0.083% nebulizer solution USE 1 VIAL VIA NEBULIZER EVERY 6 HOURS AS NEEDED FOR WHEEZING OR SHORTNESS OF BREATH 12/18/15   Debbrah Alar, NP  amoxicillin-clavulanate (AUGMENTIN) 875-125 MG  tablet Take 1 tablet by mouth 2 (two) times daily. 07/01/16   Percell Miller Saguier, PA-C  azelastine (ASTELIN) 0.1 % nasal spray Place 2 sprays into both nostrils 2 (two) times daily. Use in each nostril as directed 07/01/16   Mackie Pai, PA-C  budesonide-formoterol (SYMBICORT) 80-4.5 MCG/ACT inhaler Inhale 2 puffs into the lungs 2 (two) times daily. 08/02/15   Debbrah Alar, NP  levocetirizine (XYZAL) 5 MG tablet Take 1 tablet (5 mg total) by mouth every evening. 07/01/16   Edward Saguier, PA-C  Mometasone Furoate [facility] HFA) 200 MCG/ACT AERO Inhale 2 puffs into the lungs 2 (two) times daily. 01/01/16   Gay Filler Copland, MD  montelukast (SINGULAIR) 10 MG tablet Take 1 tablet (10 mg total) by mouth at bedtime. 07/01/16   Edward Saguier, PA-C  PROAIR HFA 108 (90 Base) MCG/ACT inhaler Inhale 1 to 2 puffs every 4 hours as needed for asthma flare ups. 01/01/16   Darreld Mclean, MD    Family History Family History  Problem Relation Age of Onset  . Arthritis Mother   . Fibromyalgia Mother     severe  . Hypertension Father   . Cancer Maternal Aunt 68    lung  . Obesity Sister   . Diabetes Sister   . Thyroid cancer Sister   . Diabetes Sister     Social History Social History  Substance Use Topics  . Smoking status: Current Every Day Smoker    Packs/day: 0.50    Years: 25.00  .  Smokeless tobacco: Never Used     Comment: 2 cigarettes on weekend  . Alcohol use 0.0 oz/week     Comment: occasional     Allergies   Advair diskus [fluticasone-salmeterol]   Review of Systems Review of Systems  Constitutional: Negative for chills and fever.  HENT: Positive for dental problem and facial swelling. Negative for trouble swallowing.   Respiratory: Negative for shortness of breath.   Cardiovascular: Negative for chest pain.  Gastrointestinal: Negative for nausea and vomiting.     Physical Exam Updated Vital Signs BP 127/80   Pulse 85   Temp 99.3 F (37.4 C) (Oral)   Resp 16   Ht  5\' 1"  (1.549 m)   Wt 167 lb (75.8 kg)   LMP 06/02/2016   SpO2 100%   BMI 31.55 kg/m   Physical Exam  Constitutional: She is oriented to person, place, and time. She appears well-developed and well-nourished.  HENT:  Head: Normocephalic and atraumatic.  Mouth/Throat: Uvula is midline, oropharynx is clear and moist and mucous membranes are normal. No trismus in the jaw. No dental abscesses.  Widespread dental decay; No gingival erythema or dental tenderness; mild tenderness noted to right zygomatic arch and right ramus of mandible; tenderness and adenopathy to the right submental region with associated swelling  Eyes: Conjunctivae and EOM are normal. Pupils are equal, round, and reactive to light.  Neck: Normal range of motion. Neck supple. No tracheal deviation present.  Musculoskeletal: Normal range of motion.  Neurological: She is alert and oriented to person, place, and time.  Skin: Skin is warm and dry.  Psychiatric: She has a normal mood and affect. Her behavior is normal.  Nursing note and vitals reviewed.  ED Treatments / Results  DIAGNOSTIC STUDIES: Oxygen Saturation is 100% on RA, normal by my interpretation.    COORDINATION OF CARE: 5:31 PM-Discussed treatment plan which includes CT scan with pt at bedside and pt agreed to plan.    Labs (all labs ordered are listed, but only abnormal results are displayed) Labs Reviewed  CBC WITH DIFFERENTIAL/PLATELET - Abnormal; Notable for the following:       Result Value   WBC 10.7 (*)    All other components within normal limits  BASIC METABOLIC PANEL - Abnormal; Notable for the following:    Glucose, Bld 106 (*)    All other components within normal limits    EKG  EKG Interpretation None       Radiology Ct Maxillofacial W Contrast  Result Date: 07/03/2016 CLINICAL DATA:  Right-sided facial swelling near mandible for several weeks. EXAM: CT MAXILLOFACIAL WITH CONTRAST TECHNIQUE: Multidetector CT imaging of the  maxillofacial structures was performed with intravenous contrast. Multiplanar CT image reconstructions were also generated. A small metallic BB was placed on the right temple in order to reliably differentiate right from left. CONTRAST:  175mL ISOVUE-300 IOPAMIDOL (ISOVUE-300) INJECTION 61% COMPARISON:  None. FINDINGS: Osseous: No fracture or mandibular dislocation. No destructive process.Right lower first and second molar periapical lucencies are identified which may represent abscesses. Orbits: Negative. No traumatic or inflammatory finding. Sinuses: Clear. Soft tissues: No significant soft tissue swelling, subcutaneous fat stranding or skin thickening. No fluid collections identified. Limited intracranial: No significant or unexpected finding. IMPRESSION: 1. Right lower periapical lucencies which may represent periapical abscess. 2. No evidence for cellulitis or soft tissue fluid collection. Electronically Signed   By: Kerby Moors M.D.   On: 07/03/2016 18:51    Procedures Procedures (including critical care time)  Medications  Ordered in ED Medications - No data to display   Initial Impression / Assessment and Plan / ED Course  I have reviewed the triage vital signs and the nursing notes.  Pertinent labs & imaging results that were available during my care of the patient were reviewed by me and considered in my medical decision making (see chart for details).  Clinical Course    BP 121/74 (BP Location: Left Arm)   Pulse 70   Temp 99.3 F (37.4 C) (Oral)   Resp 16   Ht 5\' 1"  (1.549 m)   Wt 75.8 kg   LMP 06/02/2016   SpO2 100%   BMI 31.55 kg/m    Final Clinical Impressions(s) / ED Diagnoses   Final diagnoses:  Right facial swelling  Periapical abscess with facial involvement   I personally performed the services described in this documentation, which was scribed in my presence. The recorded information has been reviewed and is accurate.     New Prescriptions New  Prescriptions   No medications on file   8:21 PM Pt here with facial swelling and R jaw pain.  CT scan shows periapical lucency suggestive of periapical abscess.  No evidence of deep tissue infection.  Pt is well appearing, protecting airway and in NAD.  Recommend continue with augmentin and f/u with dentist for further care.  Return precaution discussed.     Domenic Moras, PA-C 07/03/16 2022    [place], MD 07/03/16 [id number]

## 2016-07-03 NOTE — Patient Instructions (Addendum)
With your rapid increased swollen gland/neck region  and probable localized infection not responding to augmentin, I want you evaluated in the ED. You might get imaging studies and cbc to assess extent of infection. You may get iv antibiotics and switch of antibiotic as well.  I have talked with the ED physician working working downstairs and she is aware of your presentation.  Follow up with [place] as determined by ED or as needed

## 2016-07-03 NOTE — Progress Notes (Signed)
Pre visit review using our clinic tool,if applicable. No additional management support is needed unless otherwise documented below in the visit note.  

## 2016-07-03 NOTE — Telephone Encounter (Signed)
Pt says that she would like to switch providers from [place] to [place].    Is this switch okay?

## 2016-07-03 NOTE — Discharge Instructions (Signed)
Please follow up with dentist closely for further care.  Continue to take your antibiotic as prescribed.

## 2016-07-03 NOTE — ED Notes (Signed)
Pt verbalizes understanding of d/c instructions and denies any further needs at this time. 

## 2016-07-03 NOTE — ED Notes (Signed)
Patient returns from CT, amb with quick steady gait smiling in nad.

## 2016-07-03 NOTE — Telephone Encounter (Signed)
[place] with me if [place] approves.

## 2016-07-03 NOTE — ED Triage Notes (Signed)
Pt c/o right jaw pain and swelling x 4 days seen by PMD x 2 for same , sent here for eval

## 2016-07-03 NOTE — Progress Notes (Signed)
Subjective:    Patient ID: Makayla Hall, female    DOB: 1971-07-01, 45 y.o.   MRN: [id number]  HPI  Pt in for rt submandibular node swelling. This is worse since Monday. Pt has been on augmentin antibiotic. Pt had some ear pain, sinus pressure with hx of tooth infection past month( I saw her on Monday and treated with Augmentin). Pt yesterday had good day. But then this morning her rt side of her neck/submandilar area was swollen. When she eats she notes area enlarges.  Pt did not have enlarged lymph node on Monday exam.   Pt not reporting ear pain or  sinus pressure today.  No fevers, no chills or sweats just feels tired.  Sometimes feels like swelling causes painful  swallowing.  No problems breathing.  Review of Systems  Constitutional: Positive for fatigue. Negative for chills and fever.  HENT: Negative for congestion, ear pain, postnasal drip, sinus pressure, sneezing and sore throat.        Rt jaw pain.   Respiratory: Negative for cough, choking, chest tightness, shortness of breath and wheezing.   Cardiovascular: Negative for chest pain and palpitations.  Gastrointestinal: Negative for abdominal pain.  Neurological: Negative for dizziness and headaches.  Hematological: Positive for adenopathy. Does not bruise/bleed easily.  Psychiatric/Behavioral: Negative for behavioral problems and confusion.   Past Medical History:  Diagnosis Date  . Asthma   . Thyroid disease    hyperthyroidism  . Tobacco abuse      Social History   Social History  . Marital status: Married    Spouse name: N/A  . Number of children: N/A  . Years of education: N/A   Occupational History  . Not on file.   Social History Main Topics  . Smoking status: Current Every Day Smoker    Packs/day: 0.50    Years: 25.00  . Smokeless tobacco: Never Used     Comment: 2 cigarettes on weekend  . Alcohol use 0.0 oz/week     Comment: occasional  . Drug use: No  . Sexual activity: Not on file    Other Topics Concern  . 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  Gardening       No past surgical history on file.  Family History  Problem Relation Age of Onset  . Arthritis Mother   . Fibromyalgia Mother     severe  . Hypertension Father   . Cancer Maternal Aunt 68    lung  . Obesity Sister   . Diabetes Sister   . Thyroid cancer Sister   . Diabetes Sister     Allergies  Allergen Reactions  . Advair Diskus [Fluticasone-Salmeterol] Rash    Per pt this allergy is questionable, rash may have been from chantix instead    Current Outpatient Prescriptions on File Prior to Visit  Medication Sig Dispense Refill  . albuterol (PROVENTIL) (2.5 MG/3ML) 0.083% nebulizer solution USE 1 VIAL VIA NEBULIZER EVERY 6 HOURS AS NEEDED FOR WHEEZING OR SHORTNESS OF BREATH 150 mL 1  . amoxicillin-clavulanate (AUGMENTIN) 875-125 MG tablet Take 1 tablet by mouth 2 (two) times daily. 20 tablet 0  . azelastine (ASTELIN) 0.1 % nasal spray Place 2 sprays into both nostrils  2 (two) times daily. Use in each nostril as directed 30 mL 3  . budesonide-formoterol (SYMBICORT) 80-4.5 MCG/ACT inhaler Inhale 2 puffs into the lungs 2 (two) times daily. 1 Inhaler 3  . levocetirizine (XYZAL) 5 MG tablet Take 1 tablet (5 mg total) by mouth every evening. 30 tablet 2  . Mometasone Furoate (ASMANEX HFA) 200 MCG/ACT AERO Inhale 2 puffs into the lungs 2 (two) times daily. 1 Inhaler 12  . montelukast (SINGULAIR) 10 MG tablet Take 1 tablet (10 mg total) by mouth at bedtime. 90 tablet 1  . PROAIR HFA 108 (90 Base) MCG/ACT inhaler Inhale 1 to 2 puffs every 4 hours as needed for asthma flare ups. 3 Inhaler 3   No current facility-administered medications on file prior to visit.     BP 103/68   Pulse  84   Temp 98.9 F (37.2 C) (Oral)   Ht 5' 1.5" (1.562 m)   LMP 06/02/2016   SpO2 98%       Objective:   Physical Exam    General  Mental Status - Alert. General Appearance - Well groomed. Not in acute distress.  Skin Rashes- No Rashes. Rt temproral area. No dilated veins.  HEENT Head- Normal. Ear Auditory Canal - Left- Normal. Right - Normal.Tympanic Membrane- rt- canal mild red appearance. Tm dull-pink/faint red appearance small portion of). lt- faint dull appearance. Eye Sclera/Conjunctiva- Left- Normal. Right- Normal. Nose & Sinuses Nasal Mucosa- Left-  Boggy and Congested. Right-  Boggy and  Congested.Bilateral no  maxillary and no frontal sinus pressure. Mouth & Throat Lips: Upper Lip- Normal: no dryness, cracking, pallor, cyanosis, or vesicular eruption. Lower Lip-Normal: no dryness, cracking, pallor, cyanosis or vesicular eruption. Buccal Mucosa- Bilateral- No Aphthous ulcers. Oropharynx- No Discharge or Erythema. Tonsils: Characteristics- Bilateral- No Erythema or Congestion. Size/Enlargement- Bilateral- No enlargement. Discharge- bilateral-None Rt side no tmj crepitus on palpation Mouth- on inspection. Poor dentition. Worse on rt side. Rt mandible up toward parotid gland mild tender.  Neck Neck- Supple. No Masses. Moderate-large tender rt sumandibular node. Some localized neck swelling toward midline of neck.  Chest and Lung Exam Auscultation: Breath Sounds:-Clear even and unlabored.  Cardiovascular Auscultation:Rythm- Regular, rate and rhythm. Murmurs & Other Heart Sounds:Ausculatation of the heart reveal- No Murmurs.  Lymphatic Head & Neck General Head & Neck Lymphatics: Bilateral: Description- No Localized lymphadenopathy. See neck exam.     Assessment & Plan:  With your rapid increased swollen gland/neck region  and probable localized infection not responding to augmentin, I want you evaluated in the ED. You might get imaging studies and cbc  to assess extent of infection. You may get iv antibiotics and switch of antibiotic as well.  I have talked with the ED physician working working downstairs and she is aware of your presentation.  Follow up with [place] as determined by ED or as needed  Mick Tanguma, Percell Miller, PA-C

## 2016-07-04 NOTE — Telephone Encounter (Signed)
[place] with me. Tks.

## 2016-07-05 NOTE — Telephone Encounter (Signed)
Makayla Hall would you like to have pt scheduled a 30 minute ov to establish care?

## 2016-07-05 NOTE — Telephone Encounter (Signed)
30 minute appointment next time I see her will work. I saw her for acute the other day. Will you ask her how she is doing with her tooth?Marland Kitchen

## 2016-07-08 NOTE — Telephone Encounter (Signed)
Called Makayla Hall to make her aware. No answer. Left Makayla Hall a detailed message making her aware and ALSO, informed her to make sure that her next appt is a 30 minute with Percell Miller, per his request.    Thanks.

## 2017-01-22 ENCOUNTER — Other Ambulatory Visit: Payer: Self-pay | Admitting: Family

## 2017-01-22 NOTE — Telephone Encounter (Signed)
I got a chance to look at this in detail and review her chart at 9:30 last night. She is seeing pcp tomorrow. Would defer all refills to pcp since seeing pcp tomorrow on 01-23-2017.Marland Kitchen Would you let pt know. If by chance she is no show let me know.  Thanks,

## 2017-01-22 NOTE — Telephone Encounter (Signed)
°  Relation to CX:FQHK Call back [place]: [facility] - [place], [place] - [address] AT [place]  Reason for call:  Patient requesting a refill PROAIR HFA 108 (90 Base) MCG/ACT inhaler, montelukast (SINGULAIR) 10 MG tablet and prednisone for asthma flair up, patient scheduled follow up for 01/23/17 with PCP. Patient states if PCP cant refill all three medications, please refill PROAIR today, please advise

## 2017-01-23 ENCOUNTER — Ambulatory Visit: Payer: BLUE CROSS/BLUE SHIELD | Admitting: Medical

## 2017-01-23 ENCOUNTER — Emergency Department (HOSPITAL_BASED_OUTPATIENT_CLINIC_OR_DEPARTMENT_OTHER)
Admission: EM | Admit: 2017-01-23 | Discharge: 2017-01-23 | Disposition: A | Discharge status: Home / Self Care | Payer: BLUE CROSS/BLUE SHIELD | Attending: Emergency Medicine | Admitting: Emergency Medicine

## 2017-01-23 ENCOUNTER — Emergency Department (HOSPITAL_BASED_OUTPATIENT_CLINIC_OR_DEPARTMENT_OTHER): Payer: BLUE CROSS/BLUE SHIELD

## 2017-01-23 ENCOUNTER — Encounter (HOSPITAL_BASED_OUTPATIENT_CLINIC_OR_DEPARTMENT_OTHER): Payer: Self-pay | Admitting: *Deleted

## 2017-01-23 DIAGNOSIS — J4541 Moderate persistent asthma with (acute) exacerbation: Secondary | ICD-10-CM | POA: Insufficient documentation

## 2017-01-23 DIAGNOSIS — F17200 Nicotine dependence, unspecified, uncomplicated: Secondary | ICD-10-CM | POA: Insufficient documentation

## 2017-01-23 DIAGNOSIS — Z79899 Other long term (current) drug therapy: Secondary | ICD-10-CM | POA: Diagnosis not present

## 2017-01-23 DIAGNOSIS — R062 Wheezing: Secondary | ICD-10-CM

## 2017-01-23 DIAGNOSIS — Z720 Tobacco use: Secondary | ICD-10-CM

## 2017-01-23 DIAGNOSIS — R0602 Shortness of breath: Secondary | ICD-10-CM | POA: Diagnosis present

## 2017-01-23 LAB — CBC WITH DIFFERENTIAL/PLATELET
BASOS ABS: 0.1 10*3/uL (ref 0.0–0.1)
Basophils Relative: 1 %
EOS ABS: 0.8 10*3/uL — AB (ref 0.0–0.7)
EOS PCT: 8 %
HCT: 41.5 % (ref 36.0–46.0)
Hemoglobin: 14.1 g/dL (ref 12.0–15.0)
LYMPHS PCT: 49 %
Lymphs Abs: 4.8 10*3/uL — ABNORMAL HIGH (ref 0.7–4.0)
MCH: 30.1 pg (ref 26.0–34.0)
MCHC: 34 g/dL (ref 30.0–36.0)
MCV: 88.7 fL (ref 78.0–100.0)
MONO ABS: 0.6 10*3/uL (ref 0.1–1.0)
Monocytes Relative: 7 %
Neutro Abs: 3.4 10*3/uL (ref 1.7–7.7)
Neutrophils Relative %: 35 %
PLATELETS: 353 10*3/uL (ref 150–400)
RBC: 4.68 MIL/uL (ref 3.87–5.11)
RDW: 13.8 % (ref 11.5–15.5)
WBC: 9.7 10*3/uL (ref 4.0–10.5)

## 2017-01-23 LAB — BASIC METABOLIC PANEL
Anion gap: 6 (ref 5–15)
BUN: 10 mg/dL (ref 6–20)
CHLORIDE: 108 mmol/L (ref 101–111)
CO2: 24 mmol/L (ref 22–32)
Calcium: 8.8 mg/dL — ABNORMAL LOW (ref 8.9–10.3)
Creatinine, Ser: 0.73 mg/dL (ref 0.44–1.00)
GFR calc Af Amer: >60 mL/min (ref >60)
Glucose, Bld: 94 mg/dL (ref 65–99)
POTASSIUM: 3.9 mmol/L (ref 3.5–5.1)
SODIUM: 138 mmol/L (ref 135–145)

## 2017-01-23 MED ORDER — BUDESONIDE-FORMOTEROL FUMARATE 80-4.5 MCG/ACT IN AERO: 2.0000 | INHALATION_SPRAY | Freq: Two times a day (BID) | RESPIRATORY_TRACT | 3 refills | Status: DC

## 2017-01-23 MED ORDER — SODIUM CHLORIDE 0.9 % IV BOLUS (SEPSIS)
1000.0000 mL | Freq: Once | INTRAVENOUS | Status: AC
  Administered 2017-01-23: 1000 mL via INTRAVENOUS

## 2017-01-23 MED ORDER — PREDNISONE 10 MG (21) PO TBPK: ORAL_TABLET | ORAL | 0 refills | Status: DC

## 2017-01-23 MED ORDER — ALBUTEROL SULFATE (2.5 MG/3ML) 0.083% IN NEBU
INHALATION_SOLUTION | RESPIRATORY_TRACT | Status: AC
  Administered 2017-01-23: 2.5 mg via RESPIRATORY_TRACT
  Filled 2017-01-23: qty 3

## 2017-01-23 MED ORDER — ALBUTEROL (5 MG/ML) CONTINUOUS INHALATION SOLN
10.0000 mg/h | INHALATION_SOLUTION | Freq: Once | RESPIRATORY_TRACT | Status: AC
  Administered 2017-01-23: 10 mg/h via RESPIRATORY_TRACT
  Filled 2017-01-23: qty 20

## 2017-01-23 MED ORDER — PROAIR HFA 108 (90 BASE) MCG/ACT IN AERS: INHALATION_SPRAY | RESPIRATORY_TRACT | 3 refills | Status: DC

## 2017-01-23 MED ORDER — ALBUTEROL SULFATE (2.5 MG/3ML) 0.083% IN NEBU
2.5000 mg | INHALATION_SOLUTION | Freq: Once | RESPIRATORY_TRACT | Status: AC
  Administered 2017-01-23: 2.5 mg via RESPIRATORY_TRACT

## 2017-01-23 MED ORDER — ALBUTEROL SULFATE (2.5 MG/3ML) 0.083% IN NEBU: 2.5000 mg | INHALATION_SOLUTION | Freq: Four times a day (QID) | RESPIRATORY_TRACT | 12 refills | Status: DC | PRN

## 2017-01-23 MED ORDER — ALBUTEROL SULFATE HFA 108 (90 BASE) MCG/ACT IN AERS
1.0000 | INHALATION_SPRAY | RESPIRATORY_TRACT | Status: DC | PRN
  Administered 2017-01-23: 2 via RESPIRATORY_TRACT
  Filled 2017-01-23: qty 6.7

## 2017-01-23 MED ORDER — AEROCHAMBER PLUS FLO-VU MEDIUM MISC
1.0000 | Freq: Once | Status: DC
  Filled 2017-01-23: qty 1

## 2017-01-23 MED ORDER — MONTELUKAST SODIUM 10 MG PO TABS: 10.0000 mg | ORAL_TABLET | Freq: Every day | ORAL | 1 refills | Status: DC

## 2017-01-23 MED ORDER — AZELASTINE HCL 0.1 % NA SOLN: 2.0000 | Freq: Two times a day (BID) | NASAL | 0 refills | Status: DC

## 2017-01-23 MED ORDER — METHYLPREDNISOLONE SODIUM SUCC 125 MG IJ SOLR
125.0000 mg | Freq: Once | INTRAMUSCULAR | Status: AC
  Administered 2017-01-23: 125 mg via INTRAVENOUS
  Filled 2017-01-23: qty 2

## 2017-01-23 MED ORDER — MAGNESIUM SULFATE 2 GM/50ML IV SOLN
2.0000 g | Freq: Once | INTRAVENOUS | Status: AC
  Administered 2017-01-23: 2 g via INTRAVENOUS
  Filled 2017-01-23: qty 50

## 2017-01-23 MED ORDER — ALBUTEROL SULFATE (2.5 MG/3ML) 0.083% IN NEBU
2.5000 mg | INHALATION_SOLUTION | Freq: Once | RESPIRATORY_TRACT | Status: AC
  Administered 2017-01-23: 2.5 mg via RESPIRATORY_TRACT
  Filled 2017-01-23: qty 3

## 2017-01-23 MED ORDER — IPRATROPIUM-ALBUTEROL 0.5-2.5 (3) MG/3ML IN SOLN
3.0000 mL | Freq: Once | RESPIRATORY_TRACT | Status: AC
  Administered 2017-01-23: 3 mL via RESPIRATORY_TRACT

## 2017-01-23 MED ORDER — IPRATROPIUM-ALBUTEROL 0.5-2.5 (3) MG/3ML IN SOLN
RESPIRATORY_TRACT | Status: AC
  Administered 2017-01-23: 3 mL via RESPIRATORY_TRACT
  Filled 2017-01-23: qty 3

## 2017-01-23 MED ORDER — PHENYLEPH-CHLORPHEN-CODEINE 5-2-10 MG/5ML PO SYRP
5.0000 mL | ORAL_SOLUTION | Freq: Two times a day (BID) | ORAL | 0 refills | Status: DC
Start: 2017-01-23 — End: 2017-02-13

## 2017-01-23 MED ORDER — SODIUM CHLORIDE 0.9 % IV SOLN
INTRAVENOUS | Status: DC
  Administered 2017-01-23: 09:00:00 via INTRAVENOUS

## 2017-01-23 MED FILL — predniSONE 10 MG TABS: 10 | 12 days supply | Qty: 42 | Fill #0

## 2017-01-23 MED FILL — AZELASTINE 0.1% (137 MCG) S: 0.1 | 30 days supply | Qty: 30 | Fill #0

## 2017-01-23 NOTE — ED Notes (Signed)
After ambulating in hallway Pt in no distress. HR 115, RR 18, SpO2 96% on RA.

## 2017-01-23 NOTE — ED Triage Notes (Signed)
Pt reports 2 days of cough and SOB. Took 5 home neb treatments this morning. States she has a doctor's appt scheduled for later this morning. Pt dyspneic and speaking in phrases

## 2017-01-23 NOTE — ED Provider Notes (Signed)
[facility] DEPT [facility] Provider Note   CSN: [id number] Arrival date & time: 01/23/17  0711     History   Chief Complaint Chief Complaint  Patient presents with  . Shortness of Breath    HPI Makayla Hall is a 46 y.o. female.  Pt presents to the ED today with sob.  She said she's been sob for the past few days, but last night was really bad.  She did not have any of her asthma medications.  She does still smoke, but has been unable to smoke.      Past Medical History:  Diagnosis Date  . Asthma   . Thyroid disease    hyperthyroidism  . Tobacco abuse     Patient Active Problem List   Diagnosis Date Noted  . 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 Asthma with acute exacerbation 03/15/2015  . History of hyperthyroidism 03/15/2015  . Tobacco abuse 03/15/2015    History reviewed. No pertinent surgical history.  OB History    No data available       Home Medications    Prior to Admission medications   Medication Sig Start Date End Date Taking? Authorizing Provider  albuterol (PROVENTIL) (2.5 MG/3ML) 0.083% nebulizer solution Take 3 mLs (2.5 mg total) by nebulization every 6 (six) hours as needed for wheezing or shortness of breath. 01/23/17   Isla Pence, MD  amoxicillin-clavulanate (AUGMENTIN) 875-125 MG tablet Take 1 tablet by mouth 2 (two) times daily. 07/01/16   Percell Miller Saguier, PA-C  azelastine (ASTELIN) 0.1 % nasal spray Place 2 sprays into both nostrils 2 (two) times daily. Use in each nostril as directed 01/23/17   Isla Pence, MD  budesonide-formoterol [facility]) 80-4.5 MCG/ACT inhaler Inhale 2 puffs into the lungs 2 (two) times daily. 01/23/17   Isla Pence, MD  levocetirizine (XYZAL) 5 MG tablet Take 1 tablet (5 mg total) by mouth every evening. 07/01/16   Edward Saguier, PA-C  Mometasone Furoate [facility] HFA) 200 MCG/ACT AERO Inhale 2 puffs into the lungs 2 (two) times daily. 01/01/16   Gay Filler Copland, MD  montelukast (SINGULAIR) 10 MG tablet Take  1 tablet (10 mg total) by mouth at bedtime. 01/23/17   Isla Pence, MD  Phenyleph-Chlorphen-Codeine 01-22-09 MG/5ML SYRP Take 5 mLs by mouth 2 (two) times daily. 01/23/17   Isla Pence, MD  predniSONE (STERAPRED UNI-PAK 21 TAB) 10 MG (21) TBPK tablet Take 6 tabs by mouth daily  for 2 days, then 5 tabs for 2 days, then 4 tabs for 2 days, then 3 tabs for 2 days, 2 tabs for 2 days, then 1 tab by mouth daily for 2 days 01/23/17   Isla Pence, MD  [facility] HFA 108 [phone] Base) MCG/ACT inhaler Inhale 1 to 2 puffs every 4 hours as needed for asthma flare ups. 01/23/17   Isla Pence, MD    Family History Family History  Problem Relation Age of Onset  . Arthritis Mother   . Fibromyalgia Mother     severe  . Hypertension Father   . Cancer Maternal Aunt 68    lung  . Obesity Sister   . Diabetes Sister   . Thyroid cancer Sister   . Diabetes Sister     Social History Social History  Substance Use Topics  . Smoking status: Current Every Day Smoker    Packs/day: 0.50    Years: 25.00  . Smokeless tobacco: Never Used     Comment: 2 cigarettes on weekend  . Alcohol use 0.0 oz/week  Comment: occasional     Allergies   Advair diskus [fluticasone-salmeterol]   Review of Systems Review of Systems  Respiratory: Positive for cough, shortness of breath and wheezing.   All other systems reviewed and are negative.    Physical Exam Updated Vital Signs BP 113/70 (BP Location: Right Arm)   Pulse 93   Resp 18   Ht 5\' 1"  (1.549 m)   Wt 167 lb (75.8 kg)   LMP 01/09/2017 (Approximate)   SpO2 98%   BMI 31.55 kg/m   Physical Exam  Constitutional: She is oriented to person, place, and time. She appears well-developed and well-nourished. She appears distressed.  HENT:  Head: Normocephalic and atraumatic.  Right Ear: External ear normal.  Left Ear: External ear normal.  Nose: Nose normal.  Mouth/Throat: Oropharynx is clear and moist.  Eyes: Conjunctivae and EOM are normal. Pupils are equal,  round, and reactive to light.  Neck: Normal range of motion. Neck supple.  Cardiovascular: Normal rate, regular rhythm, normal heart sounds and intact distal pulses.   Pulmonary/Chest: Accessory muscle usage present. Tachypnea noted. She is in respiratory distress. She has wheezes.  Abdominal: Soft. Bowel sounds are normal.  Musculoskeletal: Normal range of motion.  Neurological: She is alert and oriented to person, place, and time.  Skin: Skin is warm.  Psychiatric: She has a normal mood and affect. Her behavior is normal. Judgment and thought content normal.  Nursing note and vitals reviewed.    ED Treatments / Results  Labs (all labs ordered are listed, but only abnormal results are displayed) Labs Reviewed  BASIC METABOLIC PANEL - Abnormal; Notable for the following:       Result Value   Calcium 8.8 (*)    All other components within normal limits  CBC WITH DIFFERENTIAL/PLATELET - Abnormal; Notable for the following:    Lymphs Abs 4.8 (*)    Eosinophils Absolute 0.8 (*)    All other components within normal limits    EKG  EKG Interpretation None       Radiology Dg Chest 2 View  Result Date: 01/23/2017 CLINICAL DATA:  Cough and shortness of breath for the past 2 weeks. History of asthma, current smoker. EXAM: CHEST  2 VIEW COMPARISON:  PA and lateral chest x-ray of January 01, 2016. FINDINGS: The lungs are well-expanded. There is no focal infiltrate. The interstitial markings are coarse but stable There is no pleural effusion or pneumothorax. The heart and pulmonary vascularity are normal. The mediastinum is normal in width. There is calcification in the wall of the aortic arch. The observed bony thorax is unremarkable. IMPRESSION: There is no pneumonia nor CHF. There are mild interstitial changes which are stable and consistent with known reactive airway disease. Electronically Signed   By: David  [address] M.D.   On: 01/23/2017 07:42    Procedures Procedures (including  critical care time)  Medications Ordered in ED Medications  sodium chloride 0.9 % bolus 1,000 mL (0 mLs Intravenous Stopped 01/23/17 0855)    And  0.9 %  sodium chloride infusion ( Intravenous Stopped 01/23/17 1214)  albuterol (PROVENTIL HFA;VENTOLIN HFA) 108 (90 Base) MCG/ACT inhaler 1-2 puff (2 puffs Inhalation Given 01/23/17 1208)  AEROCHAMBER PLUS FLO-VU MEDIUM MISC 1 each (not administered)  albuterol (PROVENTIL,VENTOLIN) solution continuous neb (10 mg/hr Nebulization Given 01/23/17 0741)  methylPREDNISolone sodium succinate (SOLU-MEDROL) 125 mg/2 mL injection 125 mg (125 mg Intravenous Given 01/23/17 0753)  albuterol (PROVENTIL) (2.5 MG/3ML) 0.083% nebulizer solution 2.5 mg (2.5 mg Nebulization Given 01/23/17  0712)  ipratropium-albuterol (DUONEB) 0.5-2.5 (3) MG/3ML nebulizer solution 3 mL (3 mLs Nebulization Given 01/23/17 0712)  albuterol (PROVENTIL) (2.5 MG/3ML) 0.083% nebulizer solution 2.5 mg (2.5 mg Nebulization Given 01/23/17 0929)  magnesium sulfate IVPB 2 g 50 mL (0 g Intravenous Stopped 01/23/17 1036)     Initial Impression / Assessment and Plan / ED Course  I have reviewed the triage vital signs and the nursing notes.  Pertinent labs & imaging results that were available during my care of the patient were reviewed by me and considered in my medical decision making (see chart for details).    CRITICAL CARE Performed by: Isla Pence   Total critical care time: 30 minutes  Critical care time was exclusive of separately billable procedures and treating other patients.  Critical care was necessary to treat or prevent imminent or life-threatening deterioration.  Critical care was time spent personally by me on the following activities: development of treatment plan with patient and/or surrogate as well as nursing, discussions with consultants, evaluation of patient's response to treatment, examination of patient, obtaining history from patient or surrogate, ordering and performing  treatments and interventions, ordering and review of laboratory studies, ordering and review of radiographic studies, pulse oximetry and re-evaluation of patient's condition.   Pt given 125 mg of solu-medrol and multiple doses of albuterol.  She improved, but became very sob with ambulation.  O2 sats ok with sitting, but drop with moving.  She was then given another albuterol and magnesium.  She has now improved and is able to walk without becoming sob.  O2 sats are stable Pt encouraged to stop smoking.   Pt is stable for d/c.   Final Clinical Impressions(s) / ED Diagnoses   Final diagnoses:  Tobacco abuse  Moderate persistent asthma with exacerbation    New Prescriptions New Prescriptions   ALBUTEROL (PROVENTIL) (2.5 MG/3ML) 0.083% NEBULIZER SOLUTION    Take 3 mLs (2.5 mg total) by nebulization every 6 (six) hours as needed for wheezing or shortness of breath.   PHENYLEPH-CHLORPHEN-CODEINE 01-22-09 MG/5ML SYRP    Take 5 mLs by mouth 2 (two) times daily.   PREDNISONE (STERAPRED UNI-PAK 21 TAB) 10 MG (21) TBPK TABLET    Take 6 tabs by mouth daily  for 2 days, then 5 tabs for 2 days, then 4 tabs for 2 days, then 3 tabs for 2 days, 2 tabs for 2 days, then 1 tab by mouth daily for 2 days     Isla Pence, MD 01/23/17 1225

## 2017-01-23 NOTE — ED Notes (Signed)
Patient returned from X-ray 

## 2017-01-23 NOTE — ED Notes (Signed)
Pt ambulated in dept. Resting HE 92, RR18, Spo2 97%. After ambulating in the dept. Increased WOB noted. However pt states "I feel ok" HR 115, RR 24, SpO2 92%. Pt placed back in bed and on monitor. MD made aware.

## 2017-01-23 NOTE — ED Notes (Signed)
Pt directed to pharmacy to pick up Rx. Work note given

## 2017-01-23 NOTE — ED Notes (Signed)
Pt ambulating in dept with RT

## 2017-01-23 NOTE — ED Notes (Signed)
MD at bedside discussing POC.

## 2017-01-23 NOTE — Discharge Instructions (Signed)
Do not smoke. °

## 2017-01-23 NOTE — ED Notes (Signed)
Patient transported to X-ray 

## 2017-01-23 NOTE — Telephone Encounter (Signed)
Called patient and left a message for call back. Pt is scheduled to see you today (01/23/17) at 5:45 pm.

## 2017-01-23 NOTE — Telephone Encounter (Addendum)
Note I reviewed ED note sent over. Pt was having severe asthma flair with accessory breathing described on ED note. Appears should have been seen rather than simple refilling of meds request. At time of reviewed request late last night around 9:30 pm did not question staffs statement that she will see pcp on 01-23-2017.(pcp usually off on 01-23-2017).  Would you call pt tomorrow and see how she is. She ended up in ED. See Claudia Desanctis note. Would you explain to her how late I got to review the request. How is she doing. Any questions let me know?

## 2017-01-23 NOTE — Telephone Encounter (Signed)
Patient cancelled 5:45pm appointment for today due to being seen in the ED for 5hours, patient states ED refilled her medication and will call back in the future to schedule appointment.

## 2017-01-23 NOTE — Telephone Encounter (Signed)
lvm advising patient of message below °

## 2017-01-23 NOTE — ED Notes (Signed)
RT at bedside.

## 2017-01-23 NOTE — ED Notes (Signed)
ED Provider at bedside during triage 

## 2017-02-13 ENCOUNTER — Encounter (HOSPITAL_BASED_OUTPATIENT_CLINIC_OR_DEPARTMENT_OTHER): Payer: Self-pay

## 2017-02-13 ENCOUNTER — Emergency Department (HOSPITAL_BASED_OUTPATIENT_CLINIC_OR_DEPARTMENT_OTHER): Payer: BLUE CROSS/BLUE SHIELD

## 2017-02-13 ENCOUNTER — Emergency Department (HOSPITAL_BASED_OUTPATIENT_CLINIC_OR_DEPARTMENT_OTHER)
Admission: EM | Admit: 2017-02-13 | Discharge: 2017-02-13 | Disposition: A | Discharge status: Home / Self Care | Payer: BLUE CROSS/BLUE SHIELD | Attending: Emergency Medicine | Admitting: Emergency Medicine

## 2017-02-13 DIAGNOSIS — R0781 Pleurodynia: Secondary | ICD-10-CM | POA: Diagnosis not present

## 2017-02-13 DIAGNOSIS — Z79899 Other long term (current) drug therapy: Secondary | ICD-10-CM | POA: Diagnosis not present

## 2017-02-13 DIAGNOSIS — R05 Cough: Secondary | ICD-10-CM | POA: Diagnosis present

## 2017-02-13 DIAGNOSIS — F17200 Nicotine dependence, unspecified, uncomplicated: Secondary | ICD-10-CM | POA: Diagnosis not present

## 2017-02-13 DIAGNOSIS — J4541 Moderate persistent asthma with (acute) exacerbation: Secondary | ICD-10-CM | POA: Insufficient documentation

## 2017-02-13 LAB — D-DIMER, QUANTITATIVE: D-Dimer, Quant: <0.27 ug/mL-FEU (ref 0.00–0.50)

## 2017-02-13 MED ORDER — PREDNISONE 50 MG PO TABS
60.0000 mg | ORAL_TABLET | Freq: Once | ORAL | Status: AC
  Administered 2017-02-13: 60 mg via ORAL
  Filled 2017-02-13: qty 1

## 2017-02-13 MED ORDER — ALBUTEROL SULFATE (2.5 MG/3ML) 0.083% IN NEBU
5.0000 mg | INHALATION_SOLUTION | Freq: Once | RESPIRATORY_TRACT | Status: DC
  Administered 2017-02-13: 5 mg via RESPIRATORY_TRACT

## 2017-02-13 MED ORDER — PREDNISONE 10 MG PO TABS: 40.0000 mg | ORAL_TABLET | Freq: Every day | ORAL | 0 refills | Status: AC

## 2017-02-13 MED ORDER — IPRATROPIUM BROMIDE 0.02 % IN SOLN
0.5000 mg | Freq: Once | RESPIRATORY_TRACT | Status: AC
  Administered 2017-02-13: 0.5 mg via RESPIRATORY_TRACT
  Filled 2017-02-13: qty 2.5

## 2017-02-13 MED ORDER — IPRATROPIUM-ALBUTEROL 0.5-2.5 (3) MG/3ML IN SOLN
3.0000 mL | Freq: Once | RESPIRATORY_TRACT | Status: AC
  Administered 2017-02-13: 3 mL via RESPIRATORY_TRACT
  Filled 2017-02-13: qty 3

## 2017-02-13 MED ORDER — ALBUTEROL SULFATE (2.5 MG/3ML) 0.083% IN NEBU
2.5000 mg | INHALATION_SOLUTION | Freq: Once | RESPIRATORY_TRACT | Status: AC
  Administered 2017-02-13: 2.5 mg via RESPIRATORY_TRACT
  Filled 2017-02-13: qty 3

## 2017-02-13 MED ORDER — IPRATROPIUM BROMIDE 0.02 % IN SOLN
0.5000 mg | Freq: Once | RESPIRATORY_TRACT | Status: DC
  Administered 2017-02-13: 0.5 mg via RESPIRATORY_TRACT

## 2017-02-13 MED ORDER — ALBUTEROL SULFATE (2.5 MG/3ML) 0.083% IN NEBU
5.0000 mg | INHALATION_SOLUTION | Freq: Once | RESPIRATORY_TRACT | Status: AC
  Administered 2017-02-13: 5 mg via RESPIRATORY_TRACT
  Filled 2017-02-13: qty 6

## 2017-02-13 MED ORDER — BENZONATATE 100 MG PO CAPS: 100.0000 mg | ORAL_CAPSULE | Freq: Three times a day (TID) | ORAL | 0 refills | Status: DC

## 2017-02-13 NOTE — ED Notes (Signed)
ED Provider at bedside. 

## 2017-02-13 NOTE — Discharge Instructions (Addendum)
As discussed, follow up at your pulmonology appointment.  Prednisone once a day starting tomorrow for 5 days. Tea with honey or cough drops to sooth your throat. Avoid triggers and make sure you take your anti-allergy medicine and nasal spray daily.  Return to the emergency department if you experienced any worsening, shortness of breath, chest pain or any other concerning symptoms in the meantime.

## 2017-02-13 NOTE — ED Triage Notes (Signed)
C/o bilat mid back pain "lung pain" x today-recent asthma exacerbation with prednisone-NAD-steady gait

## 2017-02-13 NOTE — ED Provider Notes (Signed)
[facility] DEPT [facility] Provider Note   CSN: [id number] Arrival date & time: 02/13/17  1856  By signing my name below, I, Collene Leyden, attest that this documentation has been prepared under the direction and in the presence of Avie Echevaria, PA-C. Electronically Signed: Collene Leyden, Scribe. 02/13/17. 7:50 PM.  History   Chief Complaint Chief Complaint  Patient presents with  . Back Pain    HPI Comments: Makayla Hall is a 46 y.o. female with a history of asthma and tobacco abuse, who presents to the Emergency Department complaining of sudden-onset, persistent cough that began several weeks ago. Patient states "my lungs hurt". Patient reports going to urgent care earlier today, in which she was advised to come here due to a possible pulmonary embolism. Patient states she was seen here 5/3 for shortness of breath/coughing, in which she was discharged with a two week course of prednisone. Prior to this episode the patient was seen in March. Patient states her asthma is exacerbated by coughing and physical exercise. No recent travel. Patient has never been intubated, but has been admitted due to asthma exacerbation in the past. Patient reports associated pain below the bilateral ribs, wheezing, and shortness of breath. No modifying factors indicated. Patient denies any chest pain, diaphoresis, LE edema, numbness, tingling, weakness, hemoptysis, calf pain, or bowel/bladder incontinence. No prolonged immobilization or recent surgeries. No recent estrogen or birth control pill use.   The history is provided by the patient. No language interpreter was used.    Past Medical History:  Diagnosis Date  . Asthma   . Thyroid disease    hyperthyroidism  . Tobacco abuse     Patient Active Problem List   Diagnosis Date Noted  . Carcinoma of cervix ([place]) 03/15/2015  . Asthma with acute exacerbation 03/15/2015  . History of hyperthyroidism 03/15/2015  . Tobacco abuse 03/15/2015     History reviewed. No pertinent surgical history.  OB History    No data available       Home Medications    Prior to Admission medications   Medication Sig Start Date End Date Taking? Authorizing Provider  albuterol (PROVENTIL) (2.5 MG/3ML) 0.083% nebulizer solution Take 3 mLs (2.5 mg total) by nebulization every 6 (six) hours as needed for wheezing or shortness of breath. 01/23/17   Isla Pence, MD  azelastine (ASTELIN) 0.1 % nasal spray Place 2 sprays into both nostrils 2 (two) times daily. Use in each nostril as directed 01/23/17   Isla Pence, MD  benzonatate (TESSALON) 100 MG capsule Take 1 capsule (100 mg total) by mouth every 8 (eight) hours. 02/13/17   Emeline General, PA-C  levocetirizine (XYZAL) 5 MG tablet Take 1 tablet (5 mg total) by mouth every evening. 07/01/16   Saguier, Percell Miller, PA-C  montelukast (SINGULAIR) 10 MG tablet Take 1 tablet (10 mg total) by mouth at bedtime. 01/23/17   Isla Pence, MD  predniSONE (DELTASONE) 10 MG tablet Take 4 tablets (40 mg total) by mouth daily. 02/13/17 02/18/17  Emeline General, PA-C  PROAIR HFA 108 [phone] Base) MCG/ACT inhaler Inhale 1 to 2 puffs every 4 hours as needed for asthma flare ups. 01/23/17   Isla Pence, MD    Family History Family History  Problem Relation Age of Onset  . Arthritis Mother   . Fibromyalgia Mother        severe  . Hypertension Father   . Cancer Maternal Aunt 68       lung  . Obesity Sister   .  Diabetes Sister   . Thyroid cancer Sister   . Diabetes Sister     Social History Social History  Substance Use Topics  . Smoking status: Current Every Day Smoker    Packs/day: 0.50    Years: 25.00  . Smokeless tobacco: Never Used     Comment: 2 cigarettes on weekend  . Alcohol use Yes     Comment: occasional     Allergies   Advair diskus [fluticasone-salmeterol]   Review of Systems Review of Systems  Constitutional: Negative for chills, diaphoresis and fever.  Respiratory: Positive  for cough, shortness of breath and wheezing.   Cardiovascular: Negative for chest pain and leg swelling.  Gastrointestinal: Negative for nausea and vomiting.  Genitourinary: Negative for flank pain.  Musculoskeletal: Positive for arthralgias (bilateral ribs).  Neurological: Negative for weakness and numbness.     Physical Exam Updated Vital Signs BP 115/66 (BP Location: Left Arm)   Pulse 85   Temp 98.6 F (37 C) (Oral)   Resp 20   Wt 79.4 kg (175 lb 1.6 oz)   LMP 01/30/2017   SpO2 100%   BMI 33.08 kg/m   Physical Exam  Constitutional: She is oriented to person, place, and time. She appears well-developed and well-nourished. No distress.  Patient is afebrile, non-toxic appearing, seated comfortably in chair in no acute distress.   HENT:  Head: Normocephalic and atraumatic.  Eyes: EOM are normal.  Neck: Normal range of motion.  Cardiovascular: Normal rate, regular rhythm and normal heart sounds.  Exam reveals no gallop and no friction rub.   No murmur heard. Pulmonary/Chest: Effort normal. No respiratory distress. She has wheezes. She has no rales.  Mild wheezing throughout.  Abdominal: Soft. She exhibits no distension. There is no tenderness.  Musculoskeletal: Normal range of motion.  Neurological: She is alert and oriented to person, place, and time.  Skin: Skin is warm and dry.  Psychiatric: She has a normal mood and affect. Judgment normal.  Nursing note and vitals reviewed.    ED Treatments / Results  DIAGNOSTIC STUDIES: Oxygen Saturation is 100% on RA, normal by my interpretation.    COORDINATION OF CARE: 7:49 PM Discussed treatment plan with pt at bedside and pt agreed to plan, which includes a chest x-ray.   Labs (all labs ordered are listed, but only abnormal results are displayed) Labs Reviewed  D-DIMER, QUANTITATIVE (NOT AT [facility])    EKG  EKG Interpretation None       Radiology Dg Chest 2 View  Result Date: 02/13/2017 CLINICAL DATA:  Cough and  wheezing EXAM: CHEST  2 VIEW COMPARISON:  01/23/2017 FINDINGS: Minimal atelectasis at the left base. No pleural effusion. Normal heart size. No pneumothorax. IMPRESSION: Minimal left basilar atelectasis. Electronically Signed   By: Donavan Foil M.D.   On: 02/13/2017 21:01    Procedures Procedures (including critical care time)  Medications Ordered in ED Medications  albuterol (PROVENTIL) (2.5 MG/3ML) 0.083% nebulizer solution 5 mg (5 mg Nebulization Given 02/13/17 1959)  ipratropium (ATROVENT) nebulizer solution 0.5 mg (0.5 mg Nebulization Given 02/13/17 1959)  predniSONE (DELTASONE) tablet 60 mg (60 mg Oral Given 02/13/17 1958)  albuterol (PROVENTIL) (2.5 MG/3ML) 0.083% nebulizer solution 2.5 mg (2.5 mg Nebulization Given 02/13/17 2157)  ipratropium-albuterol (DUONEB) 0.5-2.5 (3) MG/3ML nebulizer solution 3 mL (3 mLs Nebulization Given 02/13/17 2157)     Initial Impression / Assessment and Plan / ED Course  I have reviewed the triage vital signs and the nursing notes.  Pertinent labs &  imaging results that were available during my care of the patient were reviewed by me and considered in my medical decision making (see chart for details).      Patient presents with asthma exacerbation. She improved while in ED.   PERC negative, low suspicion for PE in this patient. Negative dimer   Patient O2 saturations maintained >90, no current signs of respiratory distress. Stable vital signs. Lung exam improved after nebulizer treatment. Prednisone given in the ED and pt will bd dc with 5 day burst. Pt states they are breathing at baseline. She has an appointment scheduled with pulmonology.  Pt has been instructed to continue using prescribed medications and to speak with Her pulmonologist about today's exacerbation.  She was afebrile, nontoxic and well-appearing.  Discussed strict return precautions and advised to return to the emergency department if experiencing any new or worsening symptoms.  Instructions were understood and patient agreed with discharge plan.  Final Clinical Impressions(s) / ED Diagnoses   Final diagnoses:  Moderate persistent asthma with exacerbation    New Prescriptions Discharge Medication List as of 02/13/2017 10:37 PM    START taking these medications   Details  benzonatate (TESSALON) 100 MG capsule Take 1 capsule (100 mg total) by mouth every 8 (eight) hours., Starting Thu 02/13/2017, Print    predniSONE (DELTASONE) 10 MG tablet Take 4 tablets (40 mg total) by mouth daily., Starting Thu 02/13/2017, Until Tue 02/18/2017, Print       I personally performed the services described in this documentation, which was scribed in my presence. The recorded information has been reviewed and is accurate.     Dossie Der 02/13/17 2254    Davonna Belling, MD 02/17/17 [phone]

## 2017-02-24 ENCOUNTER — Telehealth: Payer: Self-pay | Admitting: Medical

## 2017-02-24 DIAGNOSIS — J45909 Unspecified asthma, uncomplicated: Secondary | ICD-10-CM

## 2017-02-24 NOTE — Telephone Encounter (Signed)
Referral to Dr. Elsworth Soho pulmonologist placed.

## 2017-02-24 NOTE — Telephone Encounter (Signed)
Pt has appt with Dr. Elsworth Soho  At [facility] and states his office requested we put in referral please

## 2017-02-27 ENCOUNTER — Ambulatory Visit (INDEPENDENT_AMBULATORY_CARE_PROVIDER_SITE_OTHER): Payer: BLUE CROSS/BLUE SHIELD | Admitting: Pulmonary Disease

## 2017-02-27 ENCOUNTER — Encounter: Payer: Self-pay | Admitting: Pulmonary Disease

## 2017-02-27 VITALS — BP 116/81 | HR 76 | Ht 61.0 in | Wt 175.0 lb

## 2017-02-27 DIAGNOSIS — J45909 Unspecified asthma, uncomplicated: Secondary | ICD-10-CM | POA: Diagnosis not present

## 2017-02-27 DIAGNOSIS — J4541 Moderate persistent asthma with (acute) exacerbation: Secondary | ICD-10-CM | POA: Diagnosis not present

## 2017-02-27 DIAGNOSIS — Z720 Tobacco use: Secondary | ICD-10-CM

## 2017-02-27 MED ORDER — BUDESONIDE-FORMOTEROL FUMARATE 80-4.5 MCG/ACT IN AERO: 2.0000 | INHALATION_SPRAY | Freq: Two times a day (BID) | RESPIRATORY_TRACT | 0 refills | Status: DC

## 2017-02-27 NOTE — Assessment & Plan Note (Signed)
Reason for frequent exacerbations as unclear at this time. Does not seem to be an obviously allergic diathesis but we'll pursue allergy testing if persistent symptoms in spite of maintenance medication no obvious trigger in her environment.  Start taking Symbicort 80/4.5 - 2 puffs twice daily, rinse mouth after use -This is a maintenance medication  Use albuterol 2 puffs every 6 hours as needed for wheezing-this is a rescue medication  We discussed prednisone self administration should you have acute symptoms Prescription for prednisone will be sent to her pharmacy Prednisone 10 mg tabs  Take 2 tabs daily with food x 5ds, then 1 tab daily with food x 5ds then STOP -Please call us when you start taking this medication

## 2017-02-27 NOTE — Assessment & Plan Note (Signed)
Smoking cessation was emphasized 

## 2017-02-27 NOTE — Progress Notes (Signed)
Subjective:    Patient ID: Makayla Hall, female    DOB: 1970/10/10, 46 y.o.   MRN: [id number]  HPI Chief Complaint  Patient presents with  . Pulm Consult    Has a history of asthma. Asthma is currently controlled poorly by prednisone. Wants to discuss maintence medication for asthma. Was in the hospital for asthma last month.      47 year old smoker presents for management of asthma. She reports asthma onset in early 20s, triggers being hot and cold weather and exercise and air conditioning. She lived in [place] for many years, last hospitalization was 11 years ago for 9 days and she came close to requiring mechanical ventilation. She is moved to [place] 4 years ago and currently works as a Wellsite geologist for [employer].   She has required several steroid tapers this year. She had the flu in January. 01/23/17 she had an ER visit was given prednisone for 2 weeks. Stayed well for a week after completing this and had another urgent care visit followed by an ED visit on 5/24 for chest pain and wheezing and again required prednisone which she is completing now. D-dimer was negative. She's never had formal allergy evaluation but she denies frequent postnasal drip or heartburn.  Environment- She lives at home with her husband and daughter.. They have a dog for 4 years and she is keeping her mother's cat For the last 1 year  She smokes about half pack per day for about 25 years-about 12 pack years and drinks a glass of wine on occasion but once a week  Spirometry today shows no evidence of airway obstruction with a ratio of 83, FEV1 of 70% and FVC of 68% cystic moderate restriction but this may have been related to poor effort   Past Medical History:  Diagnosis Date  . Asthma   . Thyroid disease    hyperthyroidism  . Tobacco abuse      No past surgical history on file.  Allergies  Allergen Reactions  . Advair Diskus [Fluticasone-Salmeterol] Rash    Per pt this allergy  is questionable, rash may have been from chantix instead    Social History   Social History  . Marital status: Married    Spouse name: N/A  . Number of children: N/A  . Years of education: N/A   Occupational History  . Not on file.   Social History Main Topics  . Smoking status: Current Every Day Smoker    Packs/day: 0.50    Years: 25.00  . Smokeless tobacco: Never Used     Comment: 2 cigarettes on weekend  . Alcohol use Yes     Comment: occasional  . Drug use: No  . Sexual activity: Not on file   Other Topics Concern  . 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  Gardening         Family History  Problem Relation Age of Onset  . Arthritis Mother   . Fibromyalgia Mother        severe  . Hypertension Father   . Cancer Maternal Aunt 68       lung  . Obesity Sister   . Diabetes Sister   . Thyroid cancer  Sister   . Diabetes Sister      Review of Systems  Positive for dry cough, shortness of breath with activity, intermittent wheezing, weight gain of 5-10 pounds,  Constitutional: negative for anorexia, fevers and sweats  Eyes: negative for irritation, redness and visual disturbance  Ears, nose, mouth, throat, and face: negative for earaches, epistaxis, nasal congestion and sore throat  Respiratory: negative for dyspnea on exertion, sputum Cardiovascular: negative for chest pain, dyspnea, lower extremity edema, orthopnea, palpitations and syncope  Gastrointestinal: negative for abdominal pain, constipation, diarrhea, melena, nausea and vomiting  Genitourinary:negative for dysuria, frequency and hematuria  Hematologic/lymphatic: negative for bleeding, easy bruising and lymphadenopathy  Musculoskeletal:negative for arthralgias, muscle weakness and  stiff joints  Neurological: negative for coordination problems, gait problems, headaches and weakness  Endocrine: negative for diabetic symptoms including polydipsia, polyuria and weight loss     Objective:   Physical Exam  Gen. Pleasant, well-nourished, in no distress, normal affect ENT - no lesions, no post nasal drip Neck: No JVD, no thyromegaly, no carotid bruits Lungs: no use of accessory muscles, no dullness to percussion, clear without rales or rhonchi  Cardiovascular: Rhythm regular, heart sounds  normal, no murmurs or gallops, no peripheral edema Abdomen: soft and non-tender, no hepatosplenomegaly, BS normal. Musculoskeletal: No deformities, no cyanosis or clubbing Neuro:  alert, non focal       Assessment & Plan:

## 2017-02-27 NOTE — Patient Instructions (Signed)
Start taking Symbicort 80/4.5 - 2 puffs twice daily, rinse mouth after use -This is a maintenance medication  Use albuterol 2 puffs every 6 hours as needed for wheezing-this is a rescue medication  We discussed prednisone self administration should you have acute symptoms Prescription for prednisone will be sent to her pharmacy Prednisone 10 mg tabs  Take 2 tabs daily with food x 5ds, then 1 tab daily with food x 5ds then STOP -Please call us when you start taking this medication

## 2017-03-12 ENCOUNTER — Telehealth: Payer: Self-pay | Admitting: Pulmonary Disease

## 2017-03-12 DIAGNOSIS — J45909 Unspecified asthma, uncomplicated: Secondary | ICD-10-CM

## 2017-03-12 NOTE — Telephone Encounter (Signed)
Spoke with the pt  I advised will send her symbicort to [facility] for 90 day supply  She states she needs to have [place] wait until Friday 6/22 to send it so that they do not take money out of her acct today  I talked with Cherina about this, and she will hold in her basket and send med on 6/22 per pt request  No need to call the pt back, thanks!

## 2017-03-14 MED ORDER — BUDESONIDE-FORMOTEROL FUMARATE 80-4.5 MCG/ACT IN AERO: 2.0000 | INHALATION_SPRAY | Freq: Two times a day (BID) | RESPIRATORY_TRACT | 3 refills | Status: DC

## 2017-03-14 NOTE — Telephone Encounter (Signed)
Rx has been sent in to [facility]. Nothing further was needed. 

## 2017-03-24 ENCOUNTER — Ambulatory Visit (INDEPENDENT_AMBULATORY_CARE_PROVIDER_SITE_OTHER): Payer: BLUE CROSS/BLUE SHIELD | Admitting: Medical

## 2017-03-24 ENCOUNTER — Encounter: Payer: Self-pay | Admitting: Medical

## 2017-03-24 VITALS — BP 125/78 | HR 73 | Temp 98.0°F | Ht 61.0 in | Wt 172.0 lb

## 2017-03-24 DIAGNOSIS — E669 Obesity, unspecified: Secondary | ICD-10-CM | POA: Diagnosis not present

## 2017-03-24 DIAGNOSIS — R5383 Other fatigue: Secondary | ICD-10-CM

## 2017-03-24 DIAGNOSIS — F17200 Nicotine dependence, unspecified, uncomplicated: Secondary | ICD-10-CM

## 2017-03-24 MED ORDER — BUPROPION HCL ER (XL) 150 MG PO TB24: 150.0000 mg | ORAL_TABLET | Freq: Every day | ORAL | 1 refills | Status: DC

## 2017-03-24 NOTE — Patient Instructions (Signed)
For smoking cessation will rx wellbutrin. Rx advisement given on tapering method to stop smoking.  For fatigue cbc, cmp, and tsh.  Wellbutrin may help you loose weight and has some effectiveness in increasing concentration.  Follow up 1 month or as needed(at least my chart or call us update [place] in one week)

## 2017-03-24 NOTE — Progress Notes (Signed)
Subjective:    Patient ID: Makayla Hall, female    DOB: [birth year], 46 y.o.   MRN: [id number]  HPI   Pt in desiring wanting to stop smoking. Pt in past was on chantix. She got rash when she was on in the past. But she mentioned a slight rash on her neck about 3rd week on medicine. She was also on advair. She was not sure if was side effect of medication. No shortness of breath with faint rash and no wheezing. Pt stopped both adviar and chantix around that time and skin discoloration went away in about a month. On further discussion pt states skin had blotchy appearance and felt dry.   Pt has not been on anything else for smoking cessation. Pt never been on wellbutrin.   Desired weight loss. Some recent weight gain after weight loss last year. Also she briefly mentioned possible concentration issues at work.   Lmp-March 12, 2017.    Review of Systems  Constitutional: Positive for fatigue and unexpected weight change. Negative for chills and fever.       Weight gain.  Respiratory: Negative for apnea, cough, choking, shortness of breath and wheezing.   Cardiovascular: Negative for chest pain and palpitations.  Gastrointestinal: Negative for anal bleeding.  Musculoskeletal: Negative for back pain.  Skin: Negative for rash.  Neurological: Negative for dizziness, speech difficulty, weakness, light-headedness and headaches.  Hematological: Negative for adenopathy. Does not bruise/bleed easily.  Psychiatric/Behavioral: Negative for behavioral problems, confusion, dysphoric mood, sleep disturbance and suicidal ideas. The patient is not nervous/anxious.        Some mild decreaed concentration.    Past Medical History:  Diagnosis Date  . Asthma   . Thyroid disease    hyperthyroidism  . Tobacco abuse      Social History   Social History  . Marital status: Married    Spouse name: N/A  . Number of children: N/A  . Years of education: N/A   Occupational History  . Not on file.     Social History Main Topics  . Smoking status: Current Every Day Smoker    Packs/day: 0.50    Years: 25.00  . Smokeless tobacco: Never Used     Comment: 2 cigarettes on weekend  . Alcohol use Yes     Comment: occasional  . Drug use: No  . Sexual activity: Not on file   Other Topics Concern  . 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  Gardening       No past surgical history on file.  Family History  Problem Relation Age of Onset  . Arthritis Mother   . Fibromyalgia Mother        severe  . Hypertension Father   . Cancer Maternal Aunt 68       lung  . Obesity Sister   . Diabetes Sister   . Thyroid cancer Sister   . Diabetes Sister     Allergies  Allergen Reactions  . Advair Diskus [Fluticasone-Salmeterol] Rash    Per pt this allergy is questionable, rash may have been from chantix instead    Current Outpatient Prescriptions on File Prior to Visit  Medication Sig Dispense Refill  . albuterol (PROVENTIL) (  2.5 MG/3ML) 0.083% nebulizer solution Take 3 mLs (2.5 mg total) by nebulization every 6 (six) hours as needed for wheezing or shortness of breath. 75 mL 12  . azelastine (ASTELIN) 0.1 % nasal spray Place 2 sprays into both nostrils 2 (two) times daily. Use in each nostril as directed 30 mL 0  . budesonide-formoterol (SYMBICORT) 80-4.5 MCG/ACT inhaler Inhale 2 puffs into the lungs 2 (two) times daily. 3 Inhaler 3  . levocetirizine (XYZAL) 5 MG tablet Take 1 tablet (5 mg total) by mouth every evening. 30 tablet 2  . montelukast (SINGULAIR) 10 MG tablet Take 1 tablet (10 mg total) by mouth at bedtime. 90 tablet 1  . PROAIR HFA 108 (90 Base) MCG/ACT inhaler Inhale 1 to 2 puffs every 4 hours as needed for asthma flare ups. 3 Inhaler 3  . benzonatate  (TESSALON) 100 MG capsule Take 1 capsule (100 mg total) by mouth every 8 (eight) hours. (Patient not taking: Reported on 03/24/2017) 21 capsule 0   No current facility-administered medications on file prior to visit.     BP (!) 120/96 (BP Location: Left Arm, Patient Position: Sitting)   Pulse 73   Temp 98 F (36.7 C) (Oral)   Ht 5\' 1"  (1.549 m)   Wt 172 lb (78 kg)   BMI 32.50 kg/m       Objective:   Physical Exam   General Mental Status- Alert. General Appearance- Not in acute distress.   Skin General: Color- Normal Color. Moisture- Normal Moisture.  Neck Carotid Arteries- Normal color. Moisture- Normal Moisture. No carotid bruits. No JVD.  Chest and Lung Exam Auscultation: Breath Sounds:-Normal.  Cardiovascular Auscultation:Rythm- Regular. Murmurs & Other Heart Sounds:Auscultation of the heart reveals- No Murmurs.  Abdomen Inspection:-Inspeection Normal. Palpation/Percussion:Note:No mass. Palpation and Percussion of the abdomen reveal- Non Tender, Non Distended + BS, no rebound or guarding.   Neurologic Cranial Nerve exam:- CN III-XII intact(No nystagmus), symmetric smile. Strength:- 5/5 equal and symmetric strength both upper and lower extremities.     Assessment & Plan:  For smoking cessation will rx wellbutrin. Rx advisement given on tapering method to stop smoking.  For fatigue cbc, cmp, and tsh.  Wellbutrin may help you loose weight and has some effectiveness in increasing concentration.  Follow up 1 month or as needed(at least my chart or call us update [place] in one week)

## 2017-03-25 LAB — COMPREHENSIVE METABOLIC PANEL
ALT: 16 U/L (ref 0–35)
AST: 15 U/L (ref 0–37)
Albumin: 4.1 g/dL (ref 3.5–5.2)
Alkaline Phosphatase: 62 U/L (ref 39–117)
BUN: 20 mg/dL (ref 6–23)
CO2: 24 meq/L (ref 19–32)
Calcium: 9.3 mg/dL (ref 8.4–10.5)
Chloride: 104 mEq/L (ref 96–112)
Creatinine, Ser: 0.82 mg/dL (ref 0.40–1.20)
GFR: 79.11 mL/min (ref >60.00)
GLUCOSE: 76 mg/dL (ref 70–99)
POTASSIUM: 4.4 meq/L (ref 3.5–5.1)
Sodium: 136 mEq/L (ref 135–145)
Total Bilirubin: 0.3 mg/dL (ref 0.2–1.2)
Total Protein: 7.4 g/dL (ref 6.0–8.3)

## 2017-03-25 LAB — CBC WITH DIFFERENTIAL/PLATELET
BASOS ABS: 0.1 10*3/uL (ref 0.0–0.1)
Basophils Relative: 0.8 % (ref 0.0–3.0)
Eosinophils Absolute: 0.4 10*3/uL (ref 0.0–0.7)
Eosinophils Relative: 4.4 % (ref 0.0–5.0)
HCT: 42.7 % (ref 36.0–46.0)
Hemoglobin: 14.5 g/dL (ref 12.0–15.0)
LYMPHS ABS: 2.6 10*3/uL (ref 0.7–4.0)
Lymphocytes Relative: 27.3 % (ref 12.0–46.0)
MCHC: 33.9 g/dL (ref 30.0–36.0)
MCV: 89.9 fl (ref 78.0–100.0)
MONOS PCT: 7.6 % (ref 3.0–12.0)
Monocytes Absolute: 0.7 10*3/uL (ref 0.1–1.0)
NEUTROS ABS: 5.7 10*3/uL (ref 1.4–7.7)
NEUTROS PCT: 59.9 % (ref 43.0–77.0)
PLATELETS: 374 10*3/uL (ref 150.0–400.0)
RBC: 4.75 Mil/uL (ref 3.87–5.11)
RDW: 14.2 % (ref 11.5–15.5)
WBC: 9.6 10*3/uL (ref 4.0–10.5)

## 2017-03-25 LAB — T4, FREE: Free T4: 0.84 ng/dL (ref 0.60–1.60)

## 2017-03-25 LAB — TSH: TSH: 1.15 u[IU]/mL (ref 0.35–4.50)

## 2017-04-03 ENCOUNTER — Telehealth: Payer: Self-pay | Admitting: Medical

## 2017-04-03 NOTE — Telephone Encounter (Signed)
Pt called in to schedule an fu apt. She was prescribed Wellbutrin to stop smoking. Pt says that medication doesn't work with her and she would like to possibly switch to the patch. Pt says that she will no longer take the Wellbutrin and would like to make PCP aware. Scheduled pt for 04/24/17 to come in to discuss switching to the nicotine patch.

## 2017-04-04 NOTE — Telephone Encounter (Signed)
Noted  

## 2017-04-05 ENCOUNTER — Emergency Department (HOSPITAL_BASED_OUTPATIENT_CLINIC_OR_DEPARTMENT_OTHER)
Admission: EM | Admit: 2017-04-05 | Discharge: 2017-04-05 | Disposition: A | Discharge status: Home / Self Care | Payer: BLUE CROSS/BLUE SHIELD | Attending: Emergency Medicine | Admitting: Emergency Medicine

## 2017-04-05 ENCOUNTER — Encounter (HOSPITAL_BASED_OUTPATIENT_CLINIC_OR_DEPARTMENT_OTHER): Payer: Self-pay

## 2017-04-05 DIAGNOSIS — J45909 Unspecified asthma, uncomplicated: Secondary | ICD-10-CM | POA: Diagnosis not present

## 2017-04-05 DIAGNOSIS — Z79899 Other long term (current) drug therapy: Secondary | ICD-10-CM | POA: Diagnosis not present

## 2017-04-05 DIAGNOSIS — T7840XA Allergy, unspecified, initial encounter: Secondary | ICD-10-CM | POA: Diagnosis not present

## 2017-04-05 DIAGNOSIS — F17210 Nicotine dependence, cigarettes, uncomplicated: Secondary | ICD-10-CM | POA: Diagnosis not present

## 2017-04-05 DIAGNOSIS — R21 Rash and other nonspecific skin eruption: Secondary | ICD-10-CM | POA: Diagnosis present

## 2017-04-05 MED ORDER — FAMOTIDINE 20 MG PO TABS: 20.0000 mg | ORAL_TABLET | Freq: Two times a day (BID) | ORAL | Status: DC | PRN

## 2017-04-05 MED ORDER — ALBUTEROL SULFATE (2.5 MG/3ML) 0.083% IN NEBU
2.5000 mg | INHALATION_SOLUTION | Freq: Once | RESPIRATORY_TRACT | Status: AC
  Administered 2017-04-05: 2.5 mg via RESPIRATORY_TRACT
  Filled 2017-04-05: qty 3

## 2017-04-05 MED ORDER — DIPHENHYDRAMINE HCL 25 MG PO CAPS: 50.0000 mg | ORAL_CAPSULE | Freq: Four times a day (QID) | ORAL | Status: DC | PRN

## 2017-04-05 MED ORDER — DIPHENHYDRAMINE HCL 50 MG/ML IJ SOLN
50.0000 mg | Freq: Once | INTRAMUSCULAR | Status: AC
  Administered 2017-04-05: 50 mg via INTRAVENOUS
  Filled 2017-04-05: qty 1

## 2017-04-05 MED ORDER — FAMOTIDINE IN NACL 20-0.9 MG/50ML-% IV SOLN
20.0000 mg | Freq: Once | INTRAVENOUS | Status: AC
  Administered 2017-04-05: 20 mg via INTRAVENOUS
  Filled 2017-04-05: qty 50

## 2017-04-05 MED ORDER — PANTOPRAZOLE SODIUM 40 MG IV SOLR
40.0000 mg | Freq: Once | INTRAVENOUS | Status: AC
  Administered 2017-04-05: 40 mg via INTRAVENOUS
  Filled 2017-04-05: qty 40

## 2017-04-05 NOTE — ED Triage Notes (Signed)
Pt states dx with allergic reaction to wellbutin yesterday at [facility]; states given allegra and predisone that she started today; given steroid shot in office and hives went away but now back worse. States took a 800mg  ibuprofen 3hrs ago and feels like it is stuck; pt speaking in complete sentences

## 2017-04-05 NOTE — ED Provider Notes (Signed)
[facility] Provider Note: Georgena Spurling, MD, FACEP  CSN: [id number] MRN: [id number] ARRIVAL: 04/05/17 at 0047 ROOM: MH07/[facility]07   CHIEF COMPLAINT  Allergic Reaction   HISTORY OF PRESENT ILLNESS  Makayla Hall is a 46 y.o. female with a history of asthma. She awoke yesterday morning about 5 AM with hives. She attributes this to being on Wellbutrin which she had been taking off and on for about 8 days. She has stopped taking the Wellbutrin. She was seen in an urgent care yesterday where she was given Allegra and a shot of steroids and started on a prednisone taper. Her hives improved but worsened yesterday evening after applying topical diphenhydramine. She states the hives are both pruritic and burning. She rates her pain as a 4 out of 10. She took 800 milligrams of ibuprofen at 11 PM yesterday evening and has the sensation that it is stuck in her throat although she is able to drink fluids. She feels a tightness in her throat like it is closing off. She has an EpiPen but did not use it.   Past Medical History:  Diagnosis Date  . Asthma   . Thyroid disease    hyperthyroidism  . Tobacco abuse     History reviewed. No pertinent surgical history.  Family History  Problem Relation Age of Onset  . Arthritis Mother   . Fibromyalgia Mother        severe  . Hypertension Father   . Cancer Maternal Aunt 68       lung  . Obesity Sister   . Diabetes Sister   . Thyroid cancer Sister   . Diabetes Sister     Social History  Substance Use Topics  . Smoking status: Current Every Day Smoker    Packs/day: 0.50    Years: 25.00  . Smokeless tobacco: Never Used     Comment: 2 cigarettes on weekend  . Alcohol use Yes     Comment: occasional    Prior to Admission medications   Medication Sig Start Date End Date Taking? Authorizing Provider  albuterol (PROVENTIL) (2.5 MG/3ML) 0.083% nebulizer solution Take 3 mLs (2.5 mg total) by nebulization every 6 (six) hours as needed  for wheezing or shortness of breath. 01/23/17   Isla Pence, MD  azelastine (ASTELIN) 0.1 % nasal spray Place 2 sprays into both nostrils 2 (two) times daily. Use in each nostril as directed 01/23/17   Isla Pence, MD  benzonatate (TESSALON) 100 MG capsule Take 1 capsule (100 mg total) by mouth every 8 (eight) hours. Patient not taking: Reported on 03/24/2017 02/13/17   Avie Echevaria B, PA-C  budesonide-formoterol [facility]) 80-4.5 MCG/ACT inhaler Inhale 2 puffs into the lungs 2 (two) times daily. 03/14/17   Rigoberto Noel, MD  buPROPion (WELLBUTRIN XL) 150 MG 24 hr tablet Take 1 tablet (150 mg total) by mouth daily. 03/24/17   Saguier, Percell Miller, PA-C  levocetirizine (XYZAL) 5 MG tablet Take 1 tablet (5 mg total) by mouth every evening. 07/01/16   Saguier, Percell Miller, PA-C  montelukast (SINGULAIR) 10 MG tablet Take 1 tablet (10 mg total) by mouth at bedtime. 01/23/17   Isla Pence, MD  PROAIR HFA 108 [phone] Base) MCG/ACT inhaler Inhale 1 to 2 puffs every 4 hours as needed for asthma flare ups. 01/23/17   Isla Pence, MD    Allergies Chantix [varenicline] and Advair diskus [fluticasone-salmeterol]   REVIEW OF SYSTEMS  Negative except as noted here or in the History of Present Illness.  PHYSICAL EXAMINATION  Initial Vital Signs Blood pressure (!) 129/102, pulse 94, temperature 98.2 F (36.8 C), temperature source Oral, resp. rate 20, height 5\' 1"  (1.549 m), weight 76.2 kg (168 lb), last menstrual period 03/09/2017, SpO2 96 %.  Examination General: Well-developed, well-nourished female in no acute distress; appearance consistent with age of record HENT: normocephalic; atraumatic; no pharyngeal erythema or edema; no stridor; no dysphonia Eyes: pupils equal, round and reactive to light; extraocular muscles intact Neck: supple Heart: regular rate and rhythm Lungs: Decreased air movement bilaterally; faint expiratory wheezes in the right base Abdomen: soft; nondistended; nontender; bowel sounds  present Extremities: No deformity; full range of motion; pulses normal Neurologic: Awake, alert and oriented; motor function intact in all extremities and symmetric; no facial droop Skin: Warm and dry; generalized urticarial rash Psychiatric: Normal mood and affect   RESULTS  Summary of this visit's results, reviewed by myself:   EKG Interpretation  Date/Time:    Ventricular Rate:    PR Interval:    QRS Duration:   QT Interval:    QTC Calculation:   R Axis:     Text Interpretation:        Laboratory Studies: No results found for this or any previous visit (from the past 24 hour(s)). Imaging Studies: No results found.  ED COURSE  Nursing notes and initial vitals signs, including pulse oximetry, reviewed.  Vitals:   04/05/17 0052 04/05/17 0053 04/05/17 0147  BP: (!) 129/102    Pulse: 94    Resp: 20    Temp: 98.2 F (36.8 C)    TempSrc: Oral    SpO2: 96%  97%  Weight:  76.2 kg (168 lb)   Height:  5\' 1"  (1.549 m)    2:48 AM Urticaria fading, esophageal discomfort resolved after IV medications. We will transition her from topical to oral Benadryl. She was advised to continue her steroid taper.  PROCEDURES    ED DIAGNOSES     ICD-10-CM   1. Allergic reaction, initial encounter T78.40XA        Alee Gressman, Jenny Reichmann, MD 04/05/17 [phone]

## 2017-04-05 NOTE — ED Notes (Signed)
C/o burning/itching, knees swelling,  Rash over body onset yesterday  Thinks ws from Wellbutrin

## 2017-04-07 ENCOUNTER — Ambulatory Visit (INDEPENDENT_AMBULATORY_CARE_PROVIDER_SITE_OTHER): Payer: BLUE CROSS/BLUE SHIELD | Admitting: Medical

## 2017-04-07 ENCOUNTER — Encounter: Payer: Self-pay | Admitting: Medical

## 2017-04-07 VITALS — BP 124/81 | HR 98 | Temp 98.4°F | Resp 16 | Ht 61.0 in | Wt 175.2 lb

## 2017-04-07 DIAGNOSIS — T7840XA Allergy, unspecified, initial encounter: Secondary | ICD-10-CM

## 2017-04-07 DIAGNOSIS — F17200 Nicotine dependence, unspecified, uncomplicated: Secondary | ICD-10-CM

## 2017-04-07 MED ORDER — NICOTINE 21 MG/24HR TD PT24: 21.0000 mg | MEDICATED_PATCH | Freq: Every day | TRANSDERMAL | 0 refills | Status: DC

## 2017-04-07 NOTE — Patient Instructions (Signed)
For allergic reaction continue current taper steroids and antihistamine. If you get recurrent severe allergy type reaction/anaphylactic type then use epi pen and go to ED.  If mild-moderate allergy symptoms we can see you here or be seen in UC.  For smoking cessation stop wellbutrin and use nicotine patch. Can refill patch but lower dose next month.  If allergy type reaction reoccur and not on wellbutrin then will refer you to allergist for testing.  Follow up in 3-4 weeks or as needed

## 2017-04-07 NOTE — Progress Notes (Signed)
Subjective:    Patient ID: Makayla Hall, female    DOB: 1970/10/02, 46 y.o.   MRN: [id number]  HPI  Pt in for follow up.  Pt states last time took wellbutrin was Wed. Then she got some rash/raised are on her legs thursday. Pt states Thursday went to UC and itching was severe. At urgent care gave tapered prednisone, steroid shot and otc allergra.(she later got benadry.)    Pt states went to ED on April 05, 2017 Saturday she went to the ED. Pt shows me picture of rash in ED. Was extensive on her legs. After she left hospital at home had puffy lower lids, cheeks swollen.(swelling was symmetric). Late yesterday afternnon swelling in face started to improve.(in ED they gave her neb treatment, iv benadryl and iv pepcid. Pt did already fill the epipen from urgent care.(never used)   Pt last wellbutin tablet was on Tuesday was Wed 3 pm.  She thinks has allergy to.  On review no other suspicioius allergies.    Review of Systems  Constitutional: Negative for chills, fatigue and fever.  HENT: Negative for congestion, drooling, ear discharge and ear pain.        On Saturday she felt like ibupufen got stuck in he throat.  Respiratory: Negative for cough, chest tightness, shortness of breath and wheezing.   Cardiovascular: Negative for chest pain and palpitations.  Gastrointestinal: Negative for abdominal pain, diarrhea and nausea.  Musculoskeletal: Negative for back pain, joint swelling and neck stiffness.  Skin: Negative for rash.  Allergic/Immunologic:       Allergic reaction.  Hematological: Negative for adenopathy. Does not bruise/bleed easily.  Psychiatric/Behavioral: Negative for behavioral problems and confusion.    Past Medical History:  Diagnosis Date  . Asthma   . Thyroid disease    hyperthyroidism  . Tobacco abuse      Social History   Social History  . Marital status: Married    Spouse name: N/A  . Number of children: N/A  . Years of education: N/A   Occupational  History  . Not on file.   Social History Main Topics  . Smoking status: Current Every Day Smoker    Packs/day: 0.50    Years: 25.00  . Smokeless tobacco: Never Used     Comment: 2 cigarettes on weekend  . Alcohol use Yes     Comment: occasional  . Drug use: No  . Sexual activity: Not on file   Other Topics Concern  . 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  Gardening       No past surgical history on file.  Family History  Problem Relation Age of Onset  . Arthritis Mother   . Fibromyalgia Mother        severe  . Hypertension Father   . Cancer Maternal Aunt 68       lung  . Obesity Sister   . Diabetes Sister   . Thyroid cancer Sister   . Diabetes Sister     Allergies  Allergen Reactions  . Chantix [Varenicline]   . Advair Diskus [Fluticasone-Salmeterol] Rash    Per pt this allergy is questionable, rash may have been from chantix instead    Current Outpatient  Prescriptions on File Prior to Visit  Medication Sig Dispense Refill  . albuterol (PROVENTIL) (2.5 MG/3ML) 0.083% nebulizer solution Take 3 mLs (2.5 mg total) by nebulization every 6 (six) hours as needed for wheezing or shortness of breath. 75 mL 12  . azelastine (ASTELIN) 0.1 % nasal spray Place 2 sprays into both nostrils 2 (two) times daily. Use in each nostril as directed 30 mL 0  . budesonide-formoterol (SYMBICORT) 80-4.5 MCG/ACT inhaler Inhale 2 puffs into the lungs 2 (two) times daily. 3 Inhaler 3  . diphenhydrAMINE (BENADRYL) 25 mg capsule Take 2 capsules (50 mg total) by mouth every 6 (six) hours as needed (for hives, itching).    . famotidine (PEPCID) 20 MG tablet Take 1 tablet (20 mg total) by mouth every 12 (twelve) hours as needed (for allergic reaction).    . montelukast  (SINGULAIR) 10 MG tablet Take 1 tablet (10 mg total) by mouth at bedtime. 90 tablet 1  . PROAIR HFA 108 (90 Base) MCG/ACT inhaler Inhale 1 to 2 puffs every 4 hours as needed for asthma flare ups. 3 Inhaler 3  . buPROPion (WELLBUTRIN XL) 150 MG 24 hr tablet Take 1 tablet (150 mg total) by mouth daily. (Patient not taking: Reported on 04/07/2017) 30 tablet 1   No current facility-administered medications on file prior to visit.     BP 124/81   Pulse 98   Temp 98.4 F (36.9 C) (Oral)   Resp 16   Ht 5\' 1"  (1.549 m)   Wt 175 lb 3.2 oz (79.5 kg)   LMP 03/09/2017   SpO2 99%   BMI 33.10 kg/m       Objective:   Physical Exam  General  Mental Status - Alert. General Appearance - Well groomed. Not in acute distress.  Skin Rash on pt face resolved and on her legs. Face may look minimal puffiness at best.  HEENT Head- Normal. Ear Auditory Canal - Left- Normal. Right - Normal.Tympanic Membrane- Left- Normal. Right- Normal. Eye Sclera/Conjunctiva- Left- Normal. Right- Normal. Nose & Sinuses Nasal Mucosa- Left-  Not Boggy and Congested. Right-   Not Boggy and  Congested.Bilateral not maxillary and not frontal sinus pressure. Mouth & Throat Lips: Upper Lip- Normal: no dryness, cracking, pallor, cyanosis, or vesicular eruption. Lower Lip-Normal: no dryness, cracking, pallor, cyanosis or vesicular eruption. Buccal Mucosa- Bilateral- No Aphthous ulcers. Oropharynx- No Discharge or Erythema. Tonsils: Characteristics- Bilateral- No Erythema or Congestion. Size/Enlargement- Bilateral- No enlargement. Discharge- bilateral-None.  Neck Neck- Supple. No Masses.   Chest and Lung Exam Auscultation: Breath Sounds:-Clear even and unlabored.  Cardiovascular Auscultation:Rythm- Regular, rate and rhythm. Murmurs & Other Heart Sounds:Ausculatation of the heart reveal- No Murmurs.  Lymphatic Head & Neck General Head & Neck Lymphatics: Bilateral: Description- No Localized lymphadenopathy.        Assessment & Plan:  For allergic reaction continue current taper steroids and antihistamine. If you get recurrent severe allergy type reaction/anaphylactic type then use epi pen and go to ED.  If mild-moderate allergy symptoms we can see you here or be seen in UC.  For smoking cessation stop wellbutrin and use nicotine patch. Can refill patch but lower dose next month.  If allergy type reaction reoccur and not on wellbutrin then will refer you to allergist for testing.  Follow up in 3-4 weeks or as needed

## 2017-04-24 ENCOUNTER — Telehealth: Payer: Self-pay | Admitting: Medical

## 2017-04-24 ENCOUNTER — Ambulatory Visit: Payer: Self-pay | Admitting: Medical

## 2017-04-24 DIAGNOSIS — Z0289 Encounter for other administrative examinations: Secondary | ICD-10-CM

## 2017-04-24 NOTE — Telephone Encounter (Signed)
Pt will not be here today 5:45pm due to needing to be home for [facility] repair/maintence. Will call back to reschedule. Charge or no charge?

## 2017-04-24 NOTE — Telephone Encounter (Signed)
No charge. 

## 2017-05-08 ENCOUNTER — Ambulatory Visit: Payer: BLUE CROSS/BLUE SHIELD | Admitting: Pulmonary Disease

## 2018-07-14 ENCOUNTER — Encounter: Payer: Self-pay | Admitting: Medical

## 2018-07-14 ENCOUNTER — Ambulatory Visit (INDEPENDENT_AMBULATORY_CARE_PROVIDER_SITE_OTHER): Payer: 59 | Admitting: Medical

## 2018-07-14 VITALS — BP 129/84 | HR 75 | Temp 98.5°F | Resp 16 | Ht 61.0 in | Wt 169.6 lb

## 2018-07-14 DIAGNOSIS — Z1231 Encounter for screening mammogram for malignant neoplasm of breast: Secondary | ICD-10-CM

## 2018-07-14 DIAGNOSIS — Z8742 Personal history of other diseases of the female genital tract: Secondary | ICD-10-CM | POA: Diagnosis not present

## 2018-07-14 DIAGNOSIS — Z0000 Encounter for general adult medical examination without abnormal findings: Secondary | ICD-10-CM

## 2018-07-14 DIAGNOSIS — Z23 Encounter for immunization: Secondary | ICD-10-CM | POA: Diagnosis not present

## 2018-07-14 DIAGNOSIS — Z113 Encounter for screening for infections with a predominantly sexual mode of transmission: Secondary | ICD-10-CM | POA: Diagnosis not present

## 2018-07-14 LAB — POC URINALSYSI DIPSTICK (AUTOMATED)
Bilirubin, UA: NEGATIVE
Blood, UA: NEGATIVE
Glucose, UA: NEGATIVE
Ketones, UA: NEGATIVE
LEUKOCYTES UA: NEGATIVE
NITRITE UA: NEGATIVE
PH UA: 6 (ref 5.0–8.0)
PROTEIN UA: NEGATIVE
Spec Grav, UA: 1.025 (ref 1.010–1.025)
Urobilinogen, UA: NEGATIVE E.U./dL — AB

## 2018-07-14 NOTE — Progress Notes (Signed)
Subjective:    Patient ID: Makayla Hall, female    DOB: 10-11-1970, 47 y.o.   MRN: [id number]  HPI Pt in for follow up.  She states feeling well today. No acute complaints.  Pt works at [employer], she has been walking 3 times a week., coffee cup in am. Eating healthy.   Pt is up to date on flu vaccine.  Pt will get tdap.  Pt also wants to loose weight. She states always diets and has  Lost  10 pounds over past year.    Review of Systems  Constitutional: Negative for chills, diaphoresis, fatigue and fever.       Tyring to loose weight for one year but only lost 10 pounds. And that was just recently.  HENT: Negative for ear pain.   Respiratory: Negative for chest tightness, shortness of breath and wheezing.   Cardiovascular: Negative for chest pain and palpitations.  Gastrointestinal: Negative for abdominal pain, blood in stool, diarrhea and vomiting.  Musculoskeletal: Negative for back pain, myalgias and neck pain.  Skin: Negative for rash.  Neurological: Negative for dizziness, seizures, weakness and numbness.  Hematological: Negative for adenopathy. Does not bruise/bleed easily.  Psychiatric/Behavioral: Negative for behavioral problems, confusion, dysphoric mood and sleep disturbance. The patient is not nervous/anxious.      Past Medical History:  Diagnosis Date  . Asthma   . Thyroid disease    hyperthyroidism  . Tobacco abuse      Social History   Socioeconomic History  . Marital status: Married    Spouse name: Not on file  . Number of children: Not on file  . Years of education: Not on file  . Highest education level: Not on file  Occupational History  . Not on file  Social Needs  . Financial resource strain: Not on file  . Food insecurity:    Worry: Not on file    Inability: Not on file  . Transportation needs:    Medical: Not on file    Non-medical: Not on file  Tobacco Use  . Smoking status: Current Every Day Smoker    Packs/day: 0.50      Years: 25.00    Pack years: 12.50  . Smokeless tobacco: Never Used  . Tobacco comment: 2 cigarettes on weekend  Substance and Sexual Activity  . Alcohol use: Yes    Comment: occasional  . Drug use: No  . Sexual activity: Not on file  Lifestyle  . Physical activity:    Days per week: Not on file    Minutes per session: Not on file  . Stress: Not on file  Relationships  . Social connections:    Talks on phone: Not on file    Gets together: Not on file    Attends religious service: Not on file    Active member of club or organization: Not on file    Attends meetings of clubs or organizations: Not on file    Relationship status: Not on file  . Intimate partner violence:    Fear of current or ex partner: Not on file    Emotionally abused: Not on file    Physically abused: Not on file    Forced sexual activity: Not on file  Other Topics Concern  . 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  Gardening    No past surgical history on file.  Family History  Problem Relation Age of Onset  . Arthritis Mother   . Fibromyalgia Mother        severe  . Hypertension Father   . Cancer Maternal Aunt 68       lung  . Obesity Sister   . Diabetes Sister   . Thyroid cancer Sister   . Diabetes Sister     Allergies  Allergen Reactions  . Chantix [Varenicline]   . Advair Diskus [Fluticasone-Salmeterol] Rash    Per pt this allergy is questionable, rash may have been from chantix instead    Current Outpatient Medications on File Prior to Visit  Medication Sig Dispense Refill  . albuterol (PROVENTIL) (2.5 MG/3ML) 0.083% nebulizer solution Take 3 mLs (2.5 mg total) by nebulization every 6 (six) hours as needed for wheezing or shortness of breath. (Patient not taking:  Reported on 07/14/2018) 75 mL 12  . azelastine (ASTELIN) 0.1 % nasal spray Place 2 sprays into both nostrils 2 (two) times daily. Use in each nostril as directed (Patient not taking: Reported on 07/14/2018) 30 mL 0  . budesonide-formoterol (SYMBICORT) 80-4.5 MCG/ACT inhaler Inhale 2 puffs into the lungs 2 (two) times daily. (Patient not taking: Reported on 07/14/2018) 3 Inhaler 3  . famotidine (PEPCID) 20 MG tablet Take 1 tablet (20 mg total) by mouth every 12 (twelve) hours as needed (for allergic reaction). (Patient not taking: Reported on 07/14/2018)    . montelukast (SINGULAIR) 10 MG tablet Take 1 tablet (10 mg total) by mouth at bedtime. (Patient not taking: Reported on 07/14/2018) 90 tablet 1  . nicotine (NICODERM CQ - DOSED IN MG/24 HOURS) 21 mg/24hr patch Place 1 patch (21 mg total) onto the skin daily. (Patient not taking: Reported on 07/14/2018) 28 patch 0  . PROAIR HFA 108 (90 Base) MCG/ACT inhaler Inhale 1 to 2 puffs every 4 hours as needed for asthma flare ups. (Patient not taking: Reported on 07/14/2018) 3 Inhaler 3   No current facility-administered medications on file prior to visit.     BP 129/84   Pulse 75   Temp 98.5 F (36.9 C) (Oral)   Resp 16   Ht 5\' 1"  (1.549 m)   Wt 169 lb 9.6 oz (76.9 kg)   SpO2 100%   BMI 32.05 kg/m      Objective:   Physical Exam  General Mental Status- Alert. General Appearance- Not in acute distress.   Skin General: Color- Normal Color. Moisture- Normal Moisture.  Neck Carotid Arteries- Normal color. Moisture- Normal Moisture. No carotid bruits. No JVD.  Chest and Lung Exam Auscultation: Breath Sounds:-Normal.  Cardiovascular Auscultation:Rythm- Regular. Murmurs & Other Heart Sounds:Auscultation of the heart reveals- No Murmurs.  Abdomen Inspection:-Inspeection Normal. Palpation/Percussion:Note:No mass. Palpation and Percussion of the abdomen reveal- Non Tender, Non Distended + BS, no rebound or  guarding.  Neurologic Cranial Nerve exam:- CN III-XII intact(No nystagmus), symmetric smile. Strength:- 5/5 equal and symmetric strength both upper and lower extremities.      Assessment & Plan:  For you wellness exam today I have ordered cbc, cmp, lipid panel, ua and hiv.  Vaccine given today. Tdap.  Recommend exercise and healthy diet.  We will let you know lab results as they come in.  Follow up date appointment will be determined after lab review.   Asked pt to go down  hall and get scheduled with gyn for pap.  Mackie Pai, PA-C

## 2018-07-14 NOTE — Patient Instructions (Addendum)
For you wellness exam today I have ordered cbc, cmp, lipid panel, ua and hiv.  Vaccine given today. Tdap.  Recommend exercise and healthy diet.  We will let you know lab results as they come in.  Follow up date appointment will be determined after lab review.   Considering belviq for weight loss.  Follow up date to be determined after lab review.   Preventive Care 40-64 Years, Female Preventive care refers to lifestyle choices and visits with your health care provider that can promote health and wellness. What does preventive care include?  A yearly physical exam. This is also called an annual well check.  Dental exams once or twice a year.  Routine eye exams. Ask your health care provider how often you should have your eyes checked.  Personal lifestyle choices, including: ? Daily care of your teeth and gums. ? Regular physical activity. ? Eating a healthy diet. ? Avoiding tobacco and drug use. ? Limiting alcohol use. ? Practicing safe sex. ? Taking low-dose aspirin daily starting at age 9. ? Taking vitamin and mineral supplements as recommended by your health care provider. What happens during an annual well check? The services and screenings done by your health care provider during your annual well check will depend on your age, overall health, lifestyle risk factors, and family history of disease. Counseling Your health care provider may ask you questions about your:  Alcohol use.  Tobacco use.  Drug use.  Emotional well-being.  Home and relationship well-being.  Sexual activity.  Eating habits.  Work and work Statistician.  Method of birth control.  Menstrual cycle.  Pregnancy history.  Screening You may have the following tests or measurements:  Height, weight, and BMI.  Blood pressure.  Lipid and cholesterol levels. These may be checked every 5 years, or more frequently if you are over 4 years old.  Skin check.  Lung cancer screening. You  may have this screening every year starting at age 26 if you have a 30-pack-year history of smoking and currently smoke or have quit within the past 15 years.  Fecal occult blood test (FOBT) of the stool. You may have this test every year starting at age 84.  Flexible sigmoidoscopy or colonoscopy. You may have a sigmoidoscopy every 5 years or a colonoscopy every 10 years starting at age 80.  Hepatitis C blood test.  Hepatitis B blood test.  Sexually transmitted disease (STD) testing.  Diabetes screening. This is done by checking your blood sugar (glucose) after you have not eaten for a while (fasting). You may have this done every 1-3 years.  Mammogram. This may be done every 1-2 years. Talk to your health care provider about when you should start having regular mammograms. This may depend on whether you have a family history of breast cancer.  BRCA-related cancer screening. This may be done if you have a family history of breast, ovarian, tubal, or peritoneal cancers.  Pelvic exam and Pap test. This may be done every 3 years starting at age 40. Starting at age 60, this may be done every 5 years if you have a Pap test in combination with an HPV test.  Bone density scan. This is done to screen for osteoporosis. You may have this scan if you are at high risk for osteoporosis.  Discuss your test results, treatment options, and if necessary, the need for more tests with your health care provider. Vaccines Your health care provider may recommend certain vaccines, such as:  Influenza  vaccine. This is recommended every year.  Tetanus, diphtheria, and acellular pertussis (Tdap, Td) vaccine. You may need a Td booster every 10 years.  Varicella vaccine. You may need this if you have not been vaccinated.  Zoster vaccine. You may need this after age 20.  Measles, mumps, and rubella (MMR) vaccine. You may need at least one dose of MMR if you were born in 1957 or later. You may also need a second  dose.  Pneumococcal 13-valent conjugate (PCV13) vaccine. You may need this if you have certain conditions and were not previously vaccinated.  Pneumococcal polysaccharide (PPSV23) vaccine. You may need one or two doses if you smoke cigarettes or if you have certain conditions.  Meningococcal vaccine. You may need this if you have certain conditions.  Hepatitis A vaccine. You may need this if you have certain conditions or if you travel or work in places where you may be exposed to hepatitis A.  Hepatitis B vaccine. You may need this if you have certain conditions or if you travel or work in places where you may be exposed to hepatitis B.  Haemophilus influenzae type b (Hib) vaccine. You may need this if you have certain conditions.  Talk to your health care provider about which screenings and vaccines you need and how often you need them. This information is not intended to replace advice given to you by your health care provider. Make sure you discuss any questions you have with your health care provider. Document Released: 10/06/2015 Document Revised: 05/29/2016 Document Reviewed: 07/11/2015 Elsevier Interactive Patient Education  [employer].

## 2018-07-18 ENCOUNTER — Encounter (HOSPITAL_BASED_OUTPATIENT_CLINIC_OR_DEPARTMENT_OTHER): Payer: Self-pay

## 2018-07-18 ENCOUNTER — Ambulatory Visit (HOSPITAL_BASED_OUTPATIENT_CLINIC_OR_DEPARTMENT_OTHER)
Admission: RE | Admit: 2018-07-18 | Discharge: 2018-07-18 | Disposition: A | Discharge status: Home / Self Care | Payer: 59 | Source: Ambulatory Visit | Attending: Medical | Admitting: Medical

## 2018-07-18 DIAGNOSIS — Z1231 Encounter for screening mammogram for malignant neoplasm of breast: Secondary | ICD-10-CM | POA: Insufficient documentation

## 2018-07-20 ENCOUNTER — Telehealth: Payer: Self-pay | Admitting: Medical

## 2018-07-20 DIAGNOSIS — N63 Unspecified lump in unspecified breast: Secondary | ICD-10-CM

## 2018-07-20 NOTE — Telephone Encounter (Signed)
Future order mammogram placed as diagnostic.

## 2018-07-21 ENCOUNTER — Telehealth: Payer: Self-pay | Admitting: Medical

## 2018-07-21 ENCOUNTER — Other Ambulatory Visit: Payer: Self-pay | Admitting: Medical

## 2018-07-21 ENCOUNTER — Other Ambulatory Visit (INDEPENDENT_AMBULATORY_CARE_PROVIDER_SITE_OTHER): Payer: 59

## 2018-07-21 DIAGNOSIS — Z113 Encounter for screening for infections with a predominantly sexual mode of transmission: Secondary | ICD-10-CM

## 2018-07-21 DIAGNOSIS — R928 Other abnormal and inconclusive findings on diagnostic imaging of breast: Secondary | ICD-10-CM

## 2018-07-21 DIAGNOSIS — Z0000 Encounter for general adult medical examination without abnormal findings: Secondary | ICD-10-CM

## 2018-07-21 LAB — LIPID PANEL
Cholesterol: 254 mg/dL — ABNORMAL HIGH (ref 0–200)
HDL: 86.8 mg/dL (ref >39.00)
LDL Cholesterol: 152 mg/dL — ABNORMAL HIGH (ref 0–99)
NONHDL: 167.11
Total CHOL/HDL Ratio: 3
Triglycerides: 76 mg/dL (ref 0.0–149.0)
VLDL: 15.2 mg/dL (ref 0.0–40.0)

## 2018-07-21 LAB — CBC WITH DIFFERENTIAL/PLATELET
Basophils Absolute: 0.1 10*3/uL (ref 0.0–0.1)
Basophils Relative: 1.1 % (ref 0.0–3.0)
EOS PCT: 3.9 % (ref 0.0–5.0)
Eosinophils Absolute: 0.3 10*3/uL (ref 0.0–0.7)
HCT: 44.1 % (ref 36.0–46.0)
Hemoglobin: 14.9 g/dL (ref 12.0–15.0)
LYMPHS ABS: 2.6 10*3/uL (ref 0.7–4.0)
Lymphocytes Relative: 30.6 % (ref 12.0–46.0)
MCHC: 33.9 g/dL (ref 30.0–36.0)
MCV: 88.7 fl (ref 78.0–100.0)
MONO ABS: 0.7 10*3/uL (ref 0.1–1.0)
Monocytes Relative: 7.8 % (ref 3.0–12.0)
NEUTROS ABS: 4.7 10*3/uL (ref 1.4–7.7)
NEUTROS PCT: 56.6 % (ref 43.0–77.0)
PLATELETS: 338 10*3/uL (ref 150.0–400.0)
RBC: 4.97 Mil/uL (ref 3.87–5.11)
RDW: 14.3 % (ref 11.5–15.5)
WBC: 8.4 10*3/uL (ref 4.0–10.5)

## 2018-07-21 LAB — COMPREHENSIVE METABOLIC PANEL
ALT: 38 U/L — ABNORMAL HIGH (ref 0–35)
AST: 29 U/L (ref 0–37)
Albumin: 4.4 g/dL (ref 3.5–5.2)
Alkaline Phosphatase: 71 U/L (ref 39–117)
BUN: 14 mg/dL (ref 6–23)
CO2: 26 meq/L (ref 19–32)
Calcium: 9.3 mg/dL (ref 8.4–10.5)
Chloride: 103 mEq/L (ref 96–112)
Creatinine, Ser: 0.65 mg/dL (ref 0.40–1.20)
GFR: 102.87 mL/min (ref >60.00)
GLUCOSE: 86 mg/dL (ref 70–99)
POTASSIUM: 4.3 meq/L (ref 3.5–5.1)
Sodium: 137 mEq/L (ref 135–145)
TOTAL PROTEIN: 7.4 g/dL (ref 6.0–8.3)
Total Bilirubin: 0.4 mg/dL (ref 0.2–1.2)

## 2018-07-21 NOTE — Telephone Encounter (Signed)
Pt dropped of a examination form for Makayla Hall to complete, pt will pick up, documents placed in [place] at front office

## 2018-07-22 LAB — HIV ANTIBODY (ROUTINE TESTING W REFLEX): HIV 1&2 Ab, 4th Generation: NONREACTIVE

## 2018-07-22 NOTE — Telephone Encounter (Signed)
Received Annual Routine Physical Exam Certification Form, completed as much as possible, patient will need to sign; forwarded to provider/SLS 10/30

## 2018-07-23 ENCOUNTER — Ambulatory Visit
Admission: RE | Admit: 2018-07-23 | Discharge: 2018-07-23 | Disposition: A | Discharge status: Home / Self Care | Payer: 59 | Source: Ambulatory Visit | Attending: Medical | Admitting: Medical

## 2018-07-23 ENCOUNTER — Telehealth: Payer: Self-pay | Admitting: *Deleted

## 2018-07-23 ENCOUNTER — Other Ambulatory Visit: Payer: Self-pay | Admitting: Medical

## 2018-07-23 DIAGNOSIS — R928 Other abnormal and inconclusive findings on diagnostic imaging of breast: Secondary | ICD-10-CM

## 2018-07-23 DIAGNOSIS — N6320 Unspecified lump in the left breast, unspecified quadrant: Secondary | ICD-10-CM

## 2018-07-23 NOTE — Telephone Encounter (Signed)
Received Physician Orders from [facility]; forwarded to provider/SLS 10/31

## 2018-07-24 ENCOUNTER — Encounter: Payer: Self-pay | Admitting: Family Medicine

## 2018-07-24 ENCOUNTER — Ambulatory Visit (INDEPENDENT_AMBULATORY_CARE_PROVIDER_SITE_OTHER): Payer: 59 | Admitting: Family Medicine

## 2018-07-24 VITALS — BP 124/88 | HR 78 | Ht 61.0 in | Wt 167.1 lb

## 2018-07-24 DIAGNOSIS — Z01419 Encounter for gynecological examination (general) (routine) without abnormal findings: Secondary | ICD-10-CM

## 2018-07-24 NOTE — Progress Notes (Signed)
GYNECOLOGY ANNUAL PREVENTATIVE CARE ENCOUNTER NOTE  Subjective:   Makayla Hall is a 47 y.o. [id number] female here for a routine annual gynecologic exam.  Current complaints: none.   Denies abnormal vaginal bleeding, discharge, pelvic pain, problems with intercourse or other gynecologic concerns.    Gynecologic History Patient's last menstrual period was 05/28/2018. Patient is sexually active  Did have cervical cancer about 20 years ago. Had it "lasered off".  Last Pap: 2011. Results were: normal Last mammogram: 07/23/2018. Results were: abnormal. Has bx next week.  Obstetric History OB History  Gravida Para Term Preterm AB Living  2 1 1   1 1   SAB TAB Ectopic Multiple Live Births    1     1    # Outcome Date GA Lbr Len/2nd Weight Sex Delivery Anes PTL Lv  2 Term 2008 [redacted]w[redacted]d   F Vag-Spont EPI  LIV  1 TAB 1986            Past Medical History:  Diagnosis Date  . Asthma   . Thyroid disease    hyperthyroidism  . Tobacco abuse     History reviewed. No pertinent surgical history.  No current outpatient medications on file prior to visit.   No current facility-administered medications on file prior to visit.     Allergies  Allergen Reactions  . Chantix [Varenicline]   . Wellbutrin [Bupropion] Hives  . Advair Diskus [Fluticasone-Salmeterol] Rash    Per pt this allergy is questionable, rash may have been from chantix instead    Social History   Socioeconomic History  . Marital status: Married    Spouse name: Not on file  . Number of children: Not on file  . Years of education: Not on file  . Highest education level: Not on file  Occupational History  . Not on file  Social Needs  . Financial resource strain: Not on file  . Food insecurity:    Worry: Not on file    Inability: Not on file  . Transportation needs:    Medical: Not on file    Non-medical: Not on file  Tobacco Use  . Smoking status: Current Every Day Smoker    Packs/day: 0.50    Years: 25.00      Pack years: 12.50  . Smokeless tobacco: Never Used  . Tobacco comment: 2 cigarettes on weekend  Substance and Sexual Activity  . Alcohol use: Yes    Comment: occasional  . Drug use: No  . Sexual activity: Not on file  Lifestyle  . Physical activity:    Days per week: Not on file    Minutes per session: Not on file  . Stress: Not on file  Relationships  . Social connections:    Talks on phone: Not on file    Gets together: Not on file    Attends religious service: Not on file    Active member of club or organization: Not on file    Attends meetings of clubs or organizations: Not on file    Relationship status: Not on file  . Intimate partner violence:    Fear of current or ex partner: Not on file    Emotionally abused: Not on file    Physically abused: Not on file    Forced sexual activity: Not on file  Other Topics Concern  . 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  Gardening    Family History  Problem Relation Age of Onset  . Arthritis Mother   . Fibromyalgia Mother        severe  . Hypertension Father   . Cancer Maternal Aunt 68       lung  . Obesity Sister   . Diabetes Sister   . Thyroid cancer Sister   . Diabetes Sister     The following portions of the patient's history were reviewed and updated as appropriate: allergies, current medications, past family history, past medical history, past social history, past surgical history and problem list.  Review of Systems Pertinent items are noted in HPI.   Objective:  BP 124/88   Pulse 78   Ht 5\' 1"  (1.549 m)   Wt 167 lb 1.3 oz (75.8 kg)   LMP 05/28/2018   BMI 31.57 kg/m  CONSTITUTIONAL: Well-developed, well-nourished female in no acute distress.  HENT:  Normocephalic, atraumatic, External  right and left ear normal. Oropharynx is clear and moist EYES: Conjunctivae and EOM are normal. Pupils are equal, round, and reactive to light. No scleral icterus.  NECK: Normal range of motion, supple, no masses.  Normal thyroid.   CARDIOVASCULAR: Normal heart rate noted, regular rhythm RESPIRATORY: Clear to auscultation bilaterally. Effort and breath sounds normal, no problems with respiration noted. BREASTS: Symmetric in size. No masses, skin changes, nipple drainage, or lymphadenopathy. ABDOMEN: Soft, normal bowel sounds, no distention noted.  No tenderness, rebound or guarding.  PELVIC: Normal appearing external genitalia; normal appearing vaginal mucosa and cervix.  No abnormal discharge noted.  Normal uterine size, no other palpable masses, no uterine or adnexal tenderness. MUSCULOSKELETAL: Normal range of motion. No tenderness.  No cyanosis, clubbing, or edema.  2+ distal pulses. SKIN: Skin is warm and dry. No rash noted. Not diaphoretic. No erythema. No pallor. NEUROLOGIC: Alert and oriented to person, place, and time. Normal reflexes, muscle tone coordination. No cranial nerve deficit noted. PSYCHIATRIC: Normal mood and affect. Normal behavior. Normal judgment and thought content.  Assessment:  Annual gynecologic examination with pap smear   Plan:  1. Well Woman Exam Will follow up results of pap smear and manage accordingly. STD testing discussed. Patient requested testing  Routine preventative health maintenance measures emphasized. Please refer to After Visit Summary for other counseling recommendations.    Loma Boston, [place] for [employer]

## 2018-07-25 LAB — HIV ANTIBODY (ROUTINE TESTING W REFLEX): HIV SCREEN 4TH GENERATION: NONREACTIVE

## 2018-07-25 LAB — HEPATITIS C ANTIBODY: Hep C Virus Ab: 5.6 s/co ratio — ABNORMAL HIGH (ref 0.0–0.9)

## 2018-07-25 LAB — RPR: RPR Ser Ql: NONREACTIVE

## 2018-07-27 ENCOUNTER — Telehealth: Payer: Self-pay

## 2018-07-27 ENCOUNTER — Telehealth: Payer: Self-pay | Admitting: Medical

## 2018-07-27 MED ORDER — DIAZEPAM 5 MG PO TABS: 5.0000 mg | ORAL_TABLET | Freq: Two times a day (BID) | ORAL | 0 refills | Status: AC | PRN

## 2018-07-27 NOTE — Telephone Encounter (Signed)
Sent in 8 tabs of valium. She can take one tablet 30 minutes - 1 hour prior to procedure. Gave more tabs to use if experiencing a lot of anxiety prior to biopsy results.

## 2018-07-27 NOTE — Telephone Encounter (Signed)
I did send in valium for patient. She can take prior to breast biopsy of for anxiety if needed pending results.

## 2018-07-27 NOTE — Telephone Encounter (Signed)
Copied from [place] [id number]. Topic: General - Other >> Jul 27, 2018  4:15 PM Oneta Rack wrote: Relation to pt: self  Call back number:  [phone] Pharmacy:  Reason for call: Patient having breast biopsy Wednesday morning 07/29/18, patient requested diazepam 1 pill last week Thursday when her lab results were given, patient checking on the status of request, please advise as soon as possible.   (patient requesting medication for anxiety prior to biopsy)

## 2018-07-28 LAB — CYTOLOGY - PAP
CHLAMYDIA, DNA PROBE: NEGATIVE
Diagnosis: NEGATIVE
HPV: DETECTED — AB
Neisseria Gonorrhea: NEGATIVE
Trichomonas: NEGATIVE

## 2018-07-28 NOTE — Telephone Encounter (Signed)
LMOM informing Pt to return call. Okay for PEC to discuss.  

## 2018-07-29 ENCOUNTER — Ambulatory Visit
Admission: RE | Admit: 2018-07-29 | Discharge: 2018-07-29 | Disposition: A | Discharge status: Home / Self Care | Payer: 59 | Source: Ambulatory Visit | Attending: Medical | Admitting: Medical

## 2018-07-29 DIAGNOSIS — N6320 Unspecified lump in the left breast, unspecified quadrant: Secondary | ICD-10-CM

## 2018-07-31 ENCOUNTER — Other Ambulatory Visit: Payer: 59

## 2018-07-31 DIAGNOSIS — R768 Other specified abnormal immunological findings in serum: Secondary | ICD-10-CM

## 2018-07-31 NOTE — Progress Notes (Signed)
Patient sent to lab for lab draw. Kathrene Alu RN

## 2018-08-03 ENCOUNTER — Telehealth: Payer: Self-pay | Admitting: Medical

## 2018-08-03 LAB — HEPATITIS C VRS RNA DETECT BY PCR-QUAL: HCV RNA NAA Qualitative: NEGATIVE

## 2018-08-03 NOTE — Telephone Encounter (Signed)
Copied from [place] [id number]. Topic: Quick Communication - See Telephone Encounter >> Aug 03, 2018  3:05 PM Vernona Rieger wrote: CRM for notification. See Telephone encounter for: 08/03/18. Patient states that Percell Miller was going to prescribe her something for weight loss after her lab results came back. Patient would like a call back.

## 2018-08-04 ENCOUNTER — Telehealth: Payer: Self-pay | Admitting: Medical

## 2018-08-04 MED ORDER — LORCASERIN HCL 10 MG PO TABS: ORAL_TABLET | ORAL | 0 refills | Status: AC

## 2018-08-04 NOTE — Telephone Encounter (Signed)
Opened to review 

## 2018-08-04 NOTE — Telephone Encounter (Signed)
Opened to rx belviq.

## 2018-08-04 NOTE — Telephone Encounter (Deleted)
Will prescribe wellbutrin. Send to pharmacy. This may help loose weight. Follow up in one month to discuss or come in sooner to discuss other options. Wellbutrin is medication that can be used for depression, smoking cessation, concentration and weight loss. I will go ahead and send rx to her pharmacy.

## 2018-08-04 NOTE — Telephone Encounter (Signed)
Will rx med called belviq. Will see if medication covered. Rx sent to pt pharmacy.

## 2018-08-05 ENCOUNTER — Telehealth: Payer: Self-pay

## 2018-08-05 NOTE — Telephone Encounter (Signed)
Pt allergy list hives with bupropion. This component of contrave. So can you notify them she can't take. See if they will cover belviq.

## 2018-08-05 NOTE — Telephone Encounter (Signed)
Pt must first try and fail 16 week course of Contrave.

## 2018-08-05 NOTE — Telephone Encounter (Signed)
PA unable to be initiated via Covermymeds; forwarded to [email or web address] to be submitted. Online form completed and electronically sent. I did print copy of form that was completed and sent for scanning. Awaiting determination.

## 2018-08-05 NOTE — Telephone Encounter (Signed)
Information sent to [facility]. Awaiting determination.

## 2018-08-11 NOTE — Telephone Encounter (Signed)
PA approved. Effective 08/05/2018 to 02/03/2019.

## 2018-08-19 NOTE — Telephone Encounter (Signed)
Author phoned [facility] pharmacy to notify them of approved PA. Pharmacy stated pt. Picked up rx on 11/24.

## 2018-09-04 ENCOUNTER — Telehealth: Payer: Self-pay | Admitting: Medical

## 2018-09-04 NOTE — Telephone Encounter (Signed)
If not able to tolerate belviq/side effect does she want me to prescribe saxenda? Not sure if I tried to rx before. It is injection and not sure if her insurance would cover. She can look into med/investigate and if she wants me to rx then can try. If not then she needs to schedule office visit to discuss as can't explain various med options via phone calls/message.

## 2018-09-04 NOTE — Telephone Encounter (Signed)
Copied from [place] [id number]. Topic: Quick Communication - See Telephone Encounter >> Sep 04, 2018  1:00 PM Rutherford Nail, NT wrote: CRM for notification. See Telephone encounter for: 09/04/18. Patient would like to know if a different prescription could be sent in place of the Belviq 10mg ? States that the Belviq makes her foggy, forgetful, and she cannot take it at work. Please advise.  [id number] - [place], [place] - [address] AT [address]

## 2018-09-10 IMAGING — DX DG CHEST 2V
2 series · 2 of 2 positions shown · non-contrast
Comparison: 01/23/2017

CLINICAL DATA: Cough and wheezing

EXAM:
CHEST  2 VIEW

[chest pa]
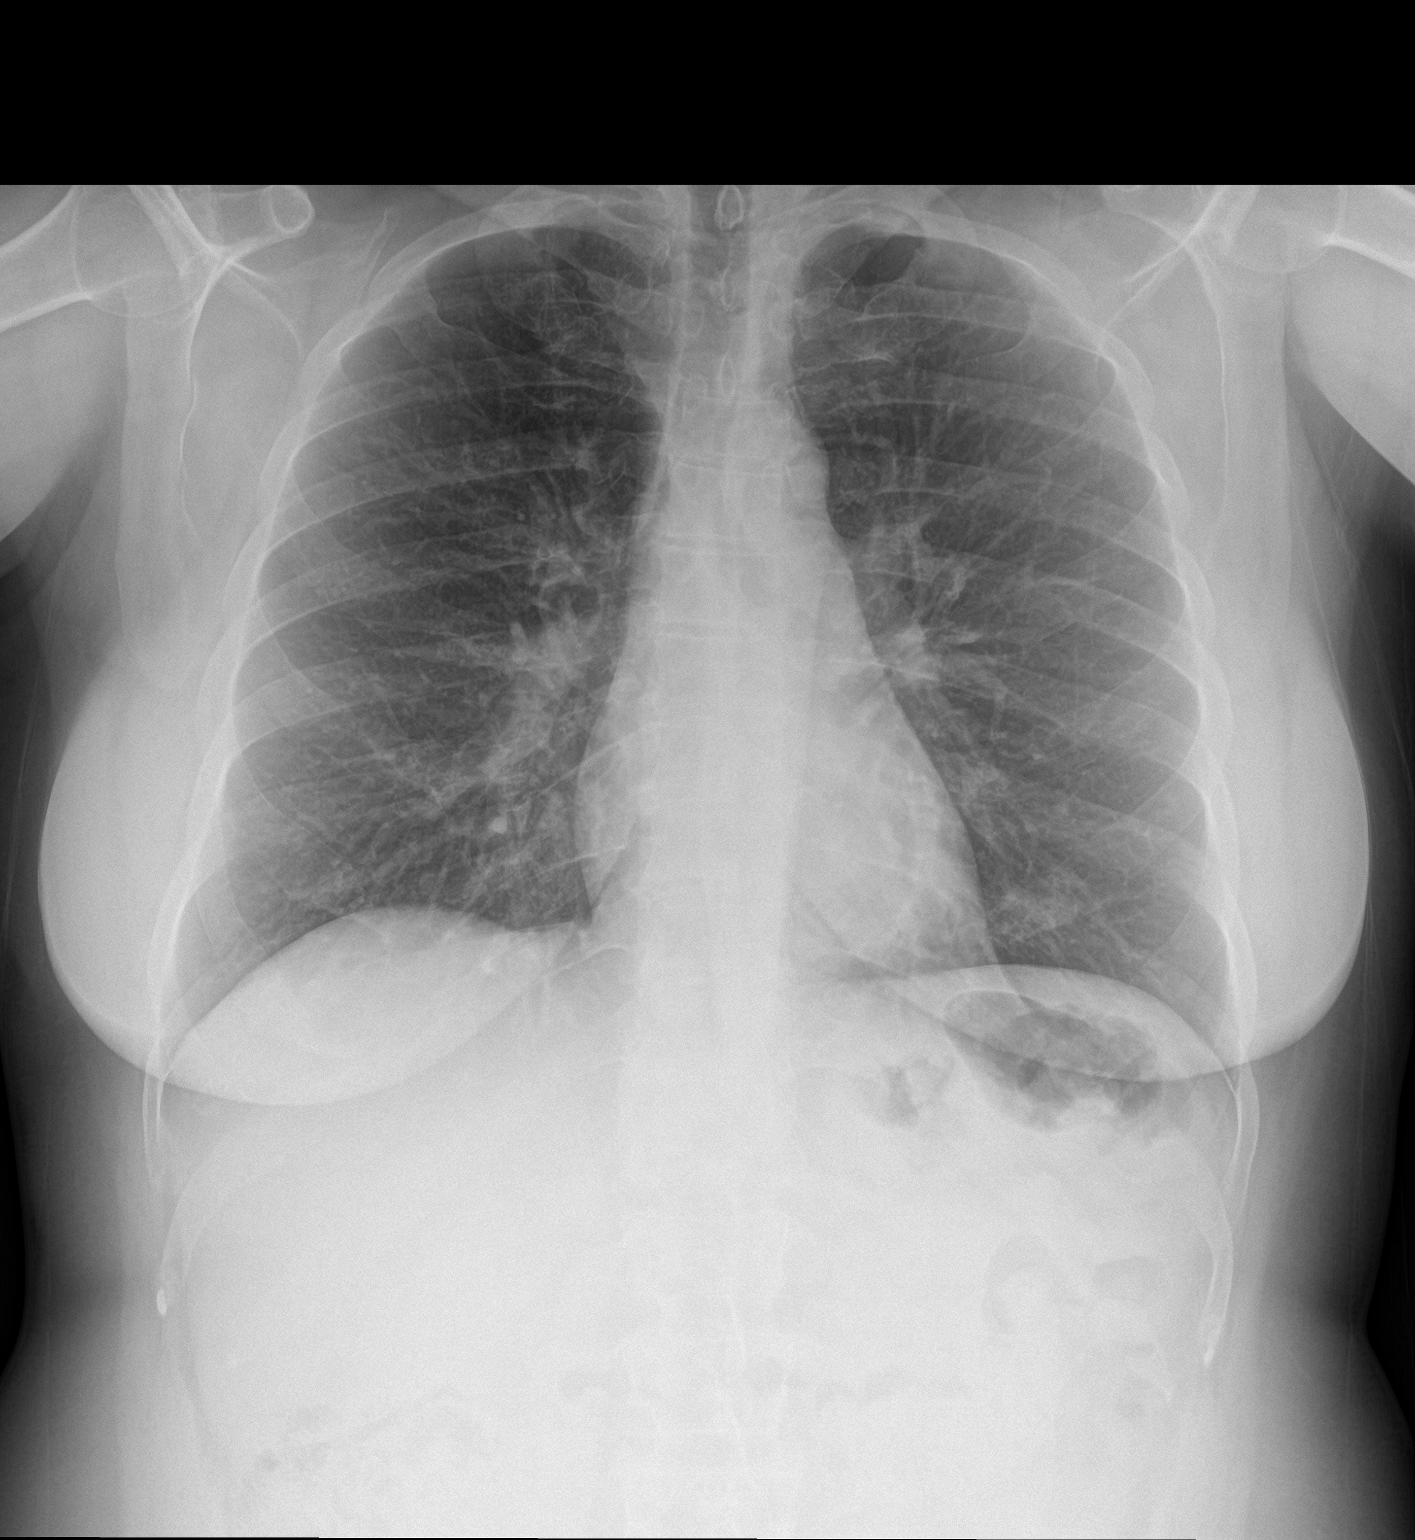

[chest lat]
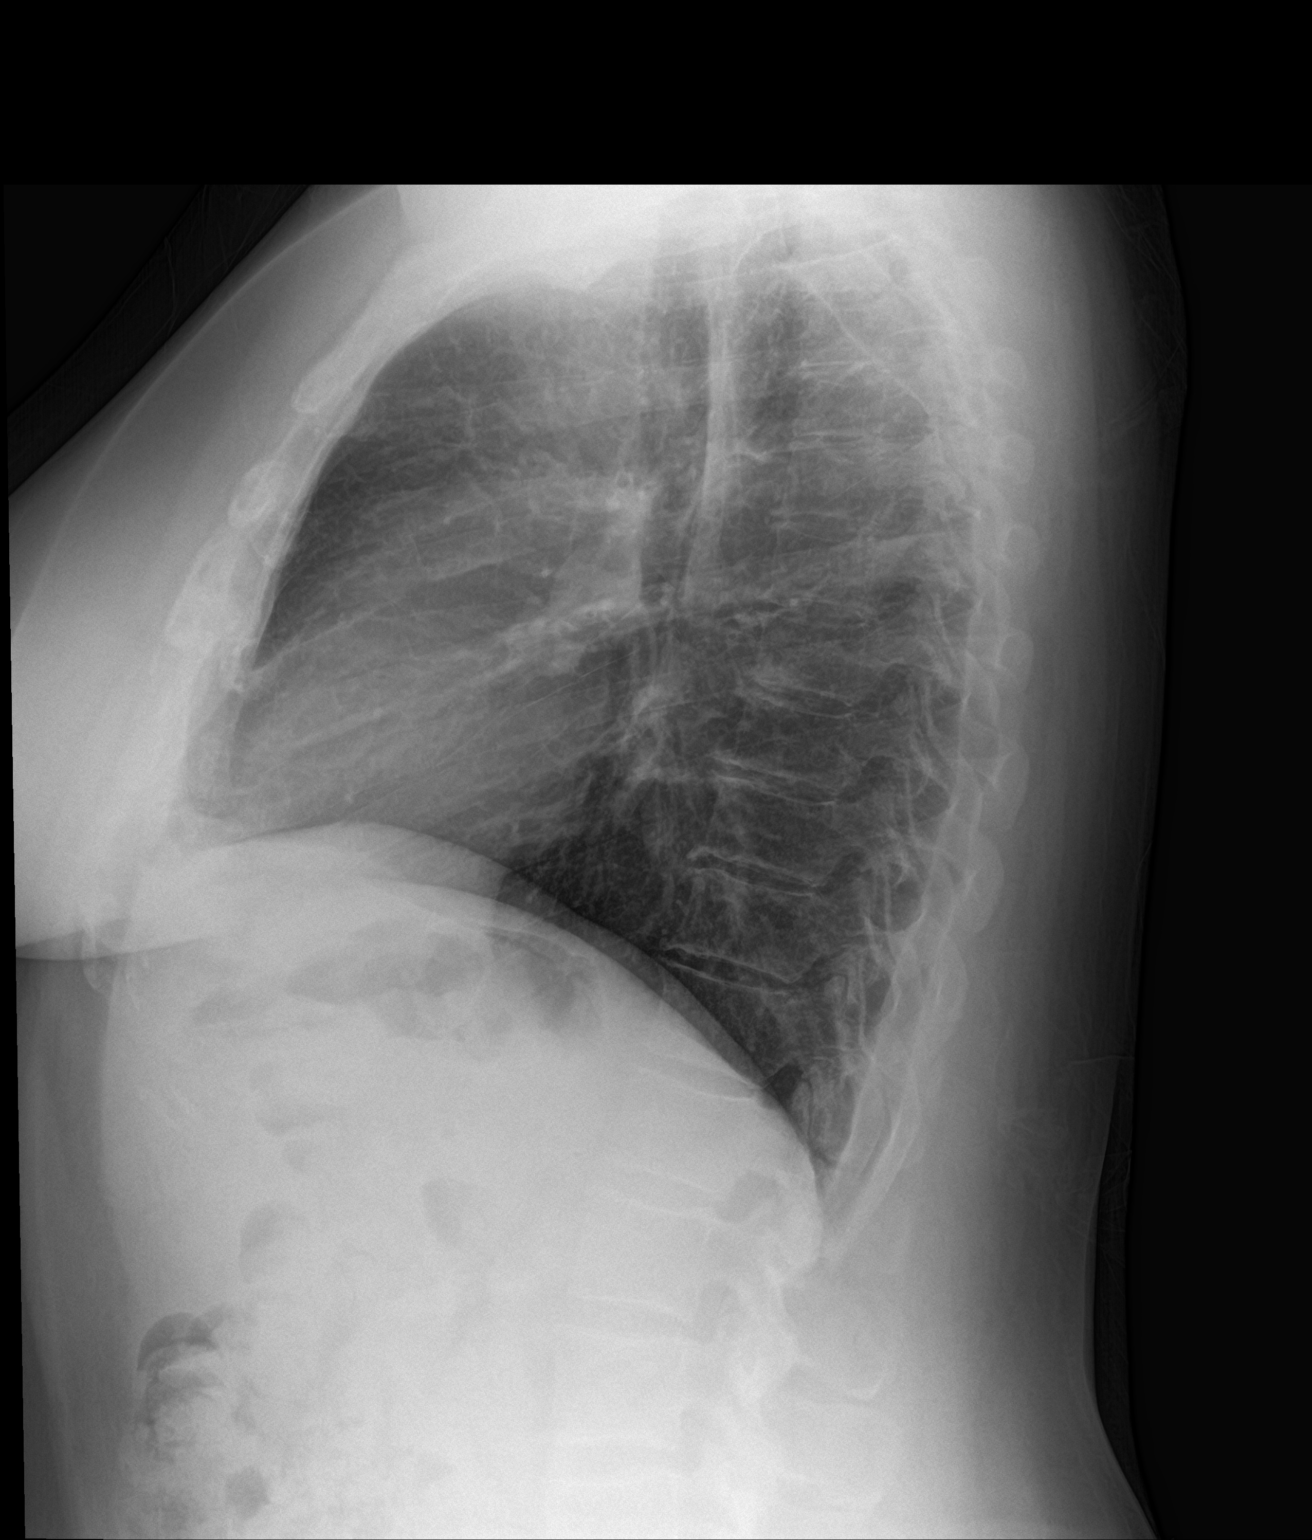

[2 of 2 positions shown; findings below may reference images not displayed]

FINDINGS: Minimal atelectasis at the left base. No pleural effusion. Normal
heart size. No pneumothorax.
IMPRESSION: Minimal left basilar atelectasis.

## 2018-09-10 NOTE — Telephone Encounter (Signed)
LMOM informing Pt to return call. Okay for PEC to discuss.  

## 2018-09-27 ENCOUNTER — Emergency Department (HOSPITAL_BASED_OUTPATIENT_CLINIC_OR_DEPARTMENT_OTHER): Payer: 59

## 2018-09-27 ENCOUNTER — Emergency Department (HOSPITAL_BASED_OUTPATIENT_CLINIC_OR_DEPARTMENT_OTHER)
Admission: EM | Admit: 2018-09-27 | Discharge: 2018-09-27 | Disposition: A | Discharge status: Home / Self Care | Payer: 59 | Attending: Emergency Medicine | Admitting: Emergency Medicine

## 2018-09-27 ENCOUNTER — Encounter (HOSPITAL_BASED_OUTPATIENT_CLINIC_OR_DEPARTMENT_OTHER): Payer: Self-pay | Admitting: Emergency Medicine

## 2018-09-27 ENCOUNTER — Other Ambulatory Visit: Payer: Self-pay

## 2018-09-27 DIAGNOSIS — Z79899 Other long term (current) drug therapy: Secondary | ICD-10-CM | POA: Diagnosis not present

## 2018-09-27 DIAGNOSIS — F17210 Nicotine dependence, cigarettes, uncomplicated: Secondary | ICD-10-CM | POA: Insufficient documentation

## 2018-09-27 DIAGNOSIS — S0990XA Unspecified injury of head, initial encounter: Secondary | ICD-10-CM | POA: Diagnosis not present

## 2018-09-27 DIAGNOSIS — Y9389 Activity, other specified: Secondary | ICD-10-CM | POA: Diagnosis not present

## 2018-09-27 DIAGNOSIS — Y929 Unspecified place or not applicable: Secondary | ICD-10-CM | POA: Insufficient documentation

## 2018-09-27 DIAGNOSIS — S63617A Unspecified sprain of left little finger, initial encounter: Secondary | ICD-10-CM | POA: Diagnosis not present

## 2018-09-27 DIAGNOSIS — S20212A Contusion of left front wall of thorax, initial encounter: Secondary | ICD-10-CM | POA: Diagnosis not present

## 2018-09-27 DIAGNOSIS — J45909 Unspecified asthma, uncomplicated: Secondary | ICD-10-CM | POA: Insufficient documentation

## 2018-09-27 DIAGNOSIS — Y999 Unspecified external cause status: Secondary | ICD-10-CM | POA: Diagnosis not present
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
## 2018-10-23 ENCOUNTER — Telehealth: Payer: Self-pay | Admitting: Medical

## 2018-10-23 ENCOUNTER — Telehealth: Payer: Self-pay | Admitting: *Deleted

## 2018-10-23 DIAGNOSIS — E785 Hyperlipidemia, unspecified: Secondary | ICD-10-CM

## 2018-10-23 NOTE — Telephone Encounter (Signed)
Pt is scheduled for lab visit on Monday, 10/26/18 for lipid panel but I do not see any future orders in Epic.  Please place order if lab is still needed or advise.

## 2018-10-23 NOTE — Telephone Encounter (Signed)
Placed future metabolic panel and F5P.

## 2018-10-23 NOTE — Telephone Encounter (Signed)
Future labs placed. 

## 2018-10-26 ENCOUNTER — Other Ambulatory Visit: Payer: 59

## 2018-10-26 ENCOUNTER — Telehealth: Payer: Self-pay | Admitting: Medical

## 2018-10-26 DIAGNOSIS — R739 Hyperglycemia, unspecified: Secondary | ICD-10-CM

## 2018-10-26 NOTE — Telephone Encounter (Signed)
Thank you :)

## 2018-10-26 NOTE — Telephone Encounter (Signed)
Makayla Hall -- future orders do not show hgb a1c was ordered. Do you want [place] to draw for a1c and if so, what diagnosis would you like to use?

## 2018-10-26 NOTE — Telephone Encounter (Signed)
a1c added

## 2020-02-12 IMAGING — MG DIGITAL SCREENING BILATERAL MAMMOGRAM WITH TOMO AND CAD
6 of 10 series · 6 of 30 positions shown · non-contrast
Comparison: None.

CLINICAL DATA: Screening.

EXAM:
DIGITAL SCREENING BILATERAL MAMMOGRAM WITH TOMO AND CAD

[L MLO synth-2D (1 of 2)]
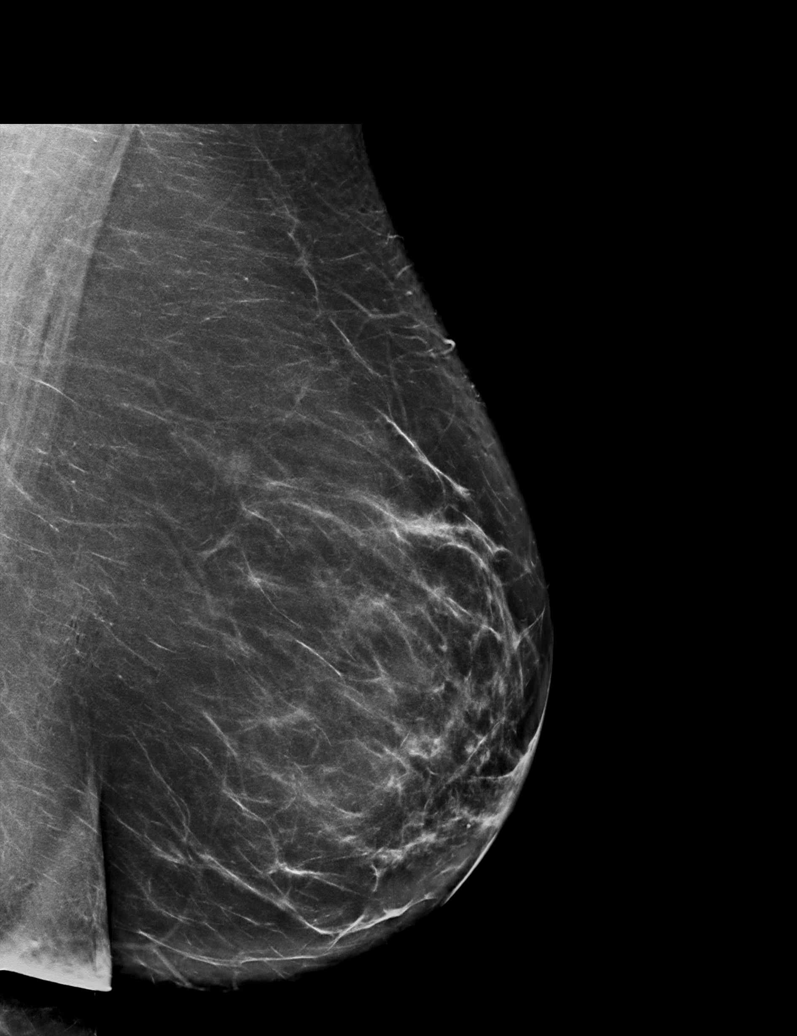

[R MLO synth-2D]
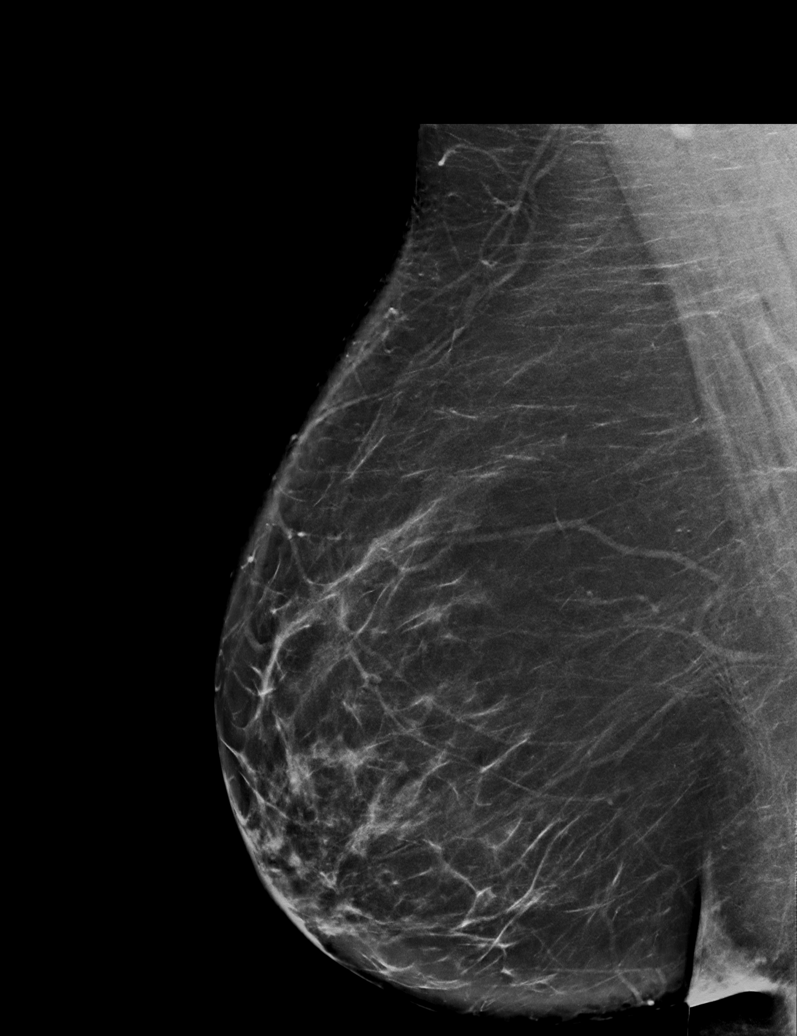

[L MLO synth-2D (2 of 2)]
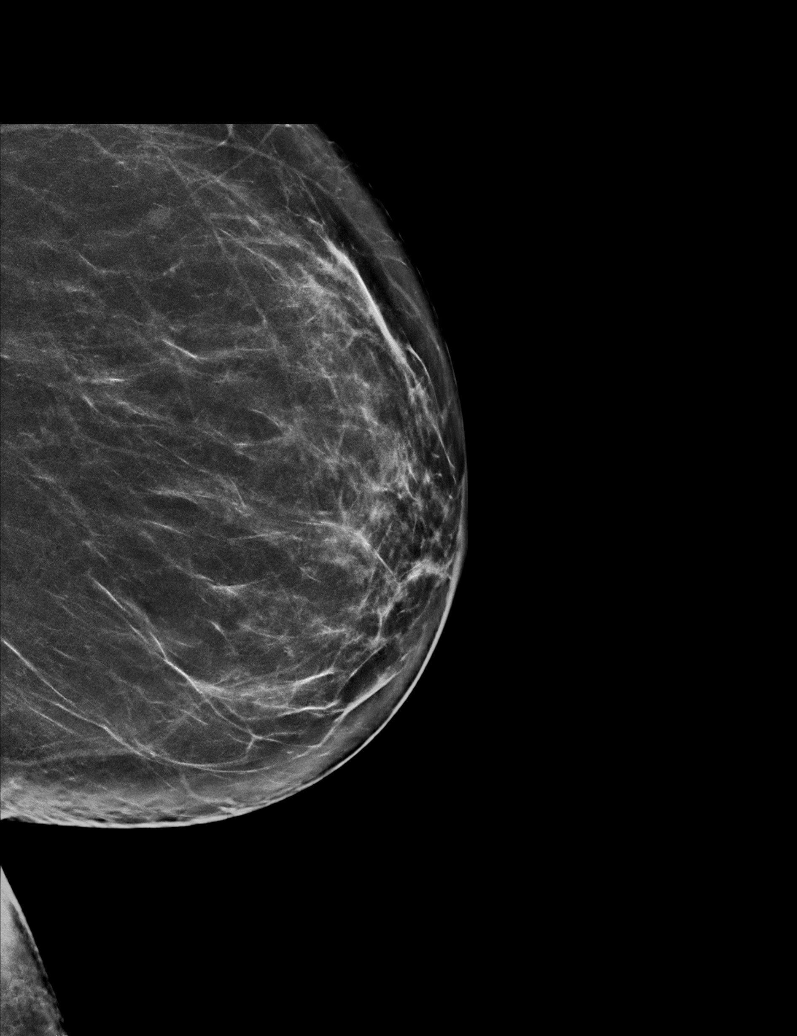

[R CC synth-2D]
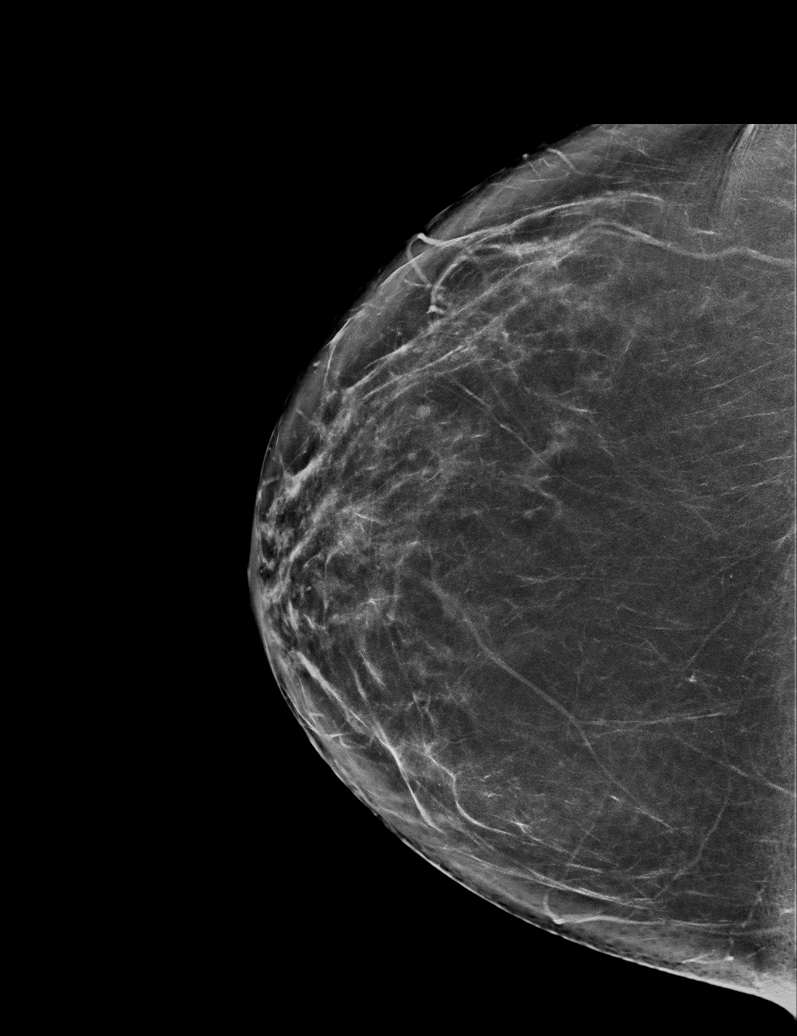

[L CC synth-2D]
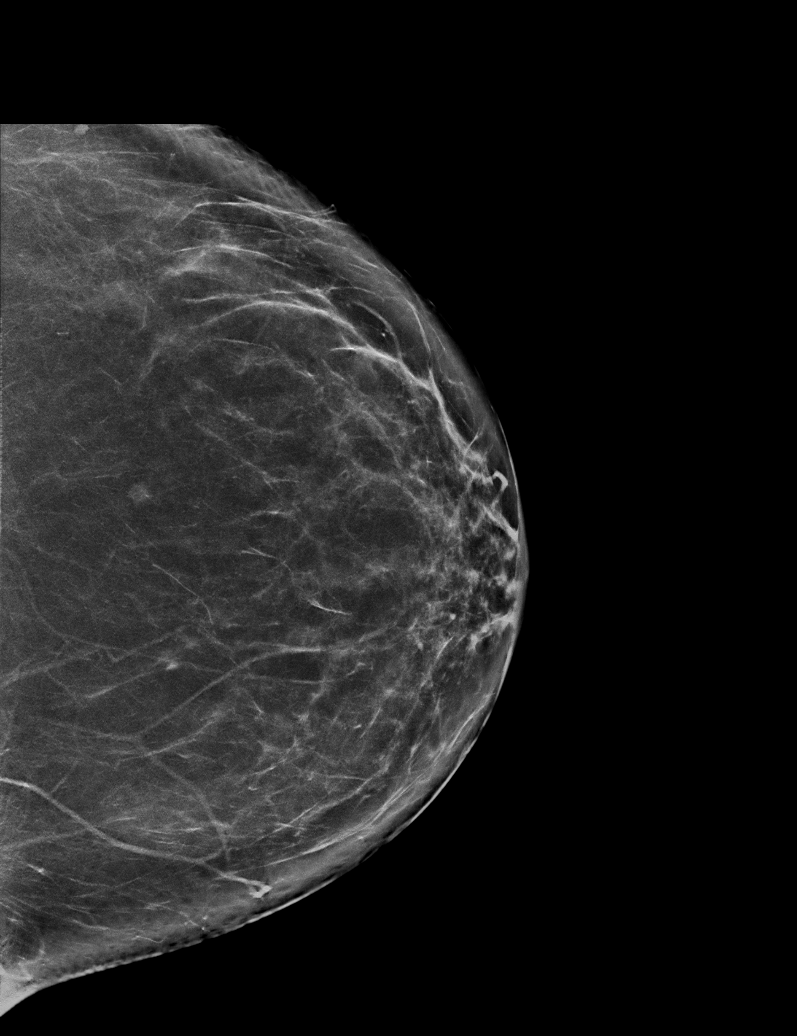

[L MLO tomo · tomo slice 39/77.0]
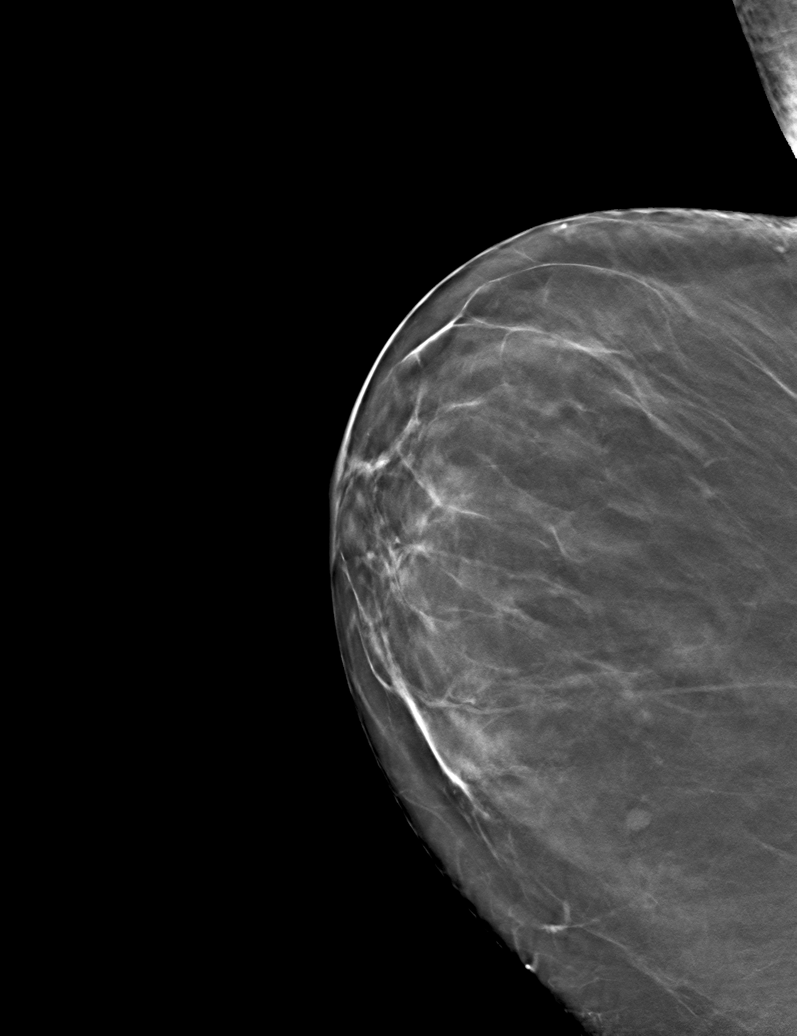

[6 of 30 positions shown; findings below may reference images not displayed]

ACR Breast Density Category b: There are scattered areas of
fibroglandular density.
FINDINGS: In the left breast, a possible mass warrants further evaluation. In
the right breast, no findings suspicious for malignancy.

Images were processed with CAD.
IMPRESSION: Further evaluation is suggested for possible mass in the left
breast.

RECOMMENDATION:
Diagnostic mammogram and possibly ultrasound of the left breast.
(Code:[id number]-[id number])

The patient will be contacted regarding the findings, and additional
imaging will be scheduled.

BI-RADS CATEGORY  0: Incomplete. Need additional imaging evaluation
and/or prior mammograms for comparison.

## 2020-11-03 NOTE — Telephone Encounter (Signed)
Error
